# Patient Record
Sex: Male | Born: 1995 | Race: Black or African American | Hispanic: No | Marital: Single | Smoking: Current every day smoker
Health system: Southern US, Community
[De-identification: ages and names within clinical notes are randomized; demographics above are authoritative.]

## PROBLEM LIST (undated history)

## (undated) DIAGNOSIS — J302 Other seasonal allergic rhinitis: Secondary | ICD-10-CM

## (undated) HISTORY — DX: Other seasonal allergic rhinitis: J30.2

## (undated) HISTORY — PX: ADENOIDECTOMY W/ MYRINGOTOMY: SHX1128

## (undated) HISTORY — PX: TYMPANOSTOMY TUBE PLACEMENT: SHX32

---
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
## 2016-01-06 DIAGNOSIS — Z113 Encounter for screening for infections with a predominantly sexual mode of transmission: Secondary | ICD-10-CM | POA: Diagnosis not present

## 2016-05-01 ENCOUNTER — Ambulatory Visit (INDEPENDENT_AMBULATORY_CARE_PROVIDER_SITE_OTHER): Payer: 59 | Admitting: Internal Medicine

## 2016-05-01 VITALS — BP 112/84 | HR 60 | Temp 98.1°F | Resp 16 | Ht 68.75 in | Wt 154.0 lb

## 2016-05-01 DIAGNOSIS — J0100 Acute maxillary sinusitis, unspecified: Secondary | ICD-10-CM

## 2016-05-01 MED ORDER — AMOXICILLIN 875 MG PO TABS: 875.0000 mg | ORAL_TABLET | Freq: Two times a day (BID) | ORAL | Status: DC

## 2016-05-01 MED ORDER — DICLOFENAC SODIUM 75 MG PO TBEC: 75.0000 mg | DELAYED_RELEASE_TABLET | Freq: Two times a day (BID) | ORAL | Status: DC

## 2016-05-01 NOTE — Progress Notes (Addendum)
Subjective:    Patient ID: Ethan LacrosseEric C Cardoza Jr., male    DOB: 04/29/1996, [age] y.o.   MRN: [id number]  HPI Chief Complaint  Patient presents with  . Headache    x1week worse today patient states he has also had nasal congestion and sore throat     HPI Comments: Ethan Lacrosseric C Teicher Jr. is a 20 y.o. male who presents to the [facility] complaining of headache that began 5 days ago. Pt reports initial symptoms sinus congestion and sore throat days ago. He also had a cough but has resolved. Headache started to right side but has moved to temple region and behind bilateral eyes. He has associated photophobia and post nasal drip. He's taken dayquil with minimal relief. He denies fever, chills and sweats. Pt denies known drug allergies.   Patient Active Problem List   Diagnosis Date Noted  . Attention deficit hyperactivity disorder 02/06/2015   Past Medical History  Diagnosis Date  . Seasonal allergies    Past Surgical History  Procedure Laterality Date  . Adenoidectomy w/ myringotomy    . Tympanostomy tube placement      as infant/toddler   No Known Allergies Prior to Admission medications   Medication Sig Start Date End Date Taking? Authorizing Provider  ibuprofen (ADVIL,MOTRIN) 200 MG tablet Take 200 mg by mouth every 6 (six) hours as needed.   Yes Historical Provider, MD   Social History   Social History  . Marital Status: Single    Spouse Name: N/A  . Number of Children: N/A  . Years of Education: N/A   Occupational History  . Not on file.   Social History Main Topics  . Smoking status: Never Smoker   . Smokeless tobacco: Never Used  . Alcohol Use: No  . Drug Use: Yes    Special: Marijuana  . Sexual Activity: Yes    Birth Control/ Protection: Condom   Other Topics Concern  . Not on file   Social History Narrative   Review of Systems  Constitutional: Negative for fever, chills and diaphoresis.  HENT: Positive for congestion, postnasal drip and  sore throat.   Eyes: Positive for photophobia.  Respiratory: Positive for cough. Negative for wheezing.   Neurological: Positive for headaches.   Objective:   Physical Exam  Constitutional: He is oriented to person, place, and time. He appears well-developed and well-nourished. No distress.  HENT:  Head: Normocephalic and atraumatic.  Right Ear: External ear normal.  Left Ear: External ear normal.  Mouth/Throat: Posterior oropharyngeal erythema present. No oropharyngeal exudate.  Purulent discharge bilaterally with swollen turbinates.   Eyes: Conjunctivae and EOM are normal. Pupils are equal, round, and reactive to light.  Neck: Neck supple.  Cardiovascular: Normal rate.   Pulmonary/Chest: Effort normal.  Musculoskeletal: Normal range of motion.  Lymphadenopathy:    He has no cervical adenopathy.  Neurological: He is alert and oriented to person, place, and time.  Skin: Skin is warm and dry.  Psychiatric: He has a normal mood and affect. His behavior is normal.  Nursing note and vitals reviewed.  Filed Vitals:   05/01/16 1121  BP: 112/84  Pulse: 60  Temp: 98.1 F (36.7 C)  TempSrc: Oral  Resp: 16  Height: 5' 8.75" (1.746 m)  Weight: 154 lb (69.854 kg)  SpO2: 99%    Assessment & Plan:  Acute maxillary sinusitis, recurrence not specified Headache secondary  Meds ordered this encounter  Medications  . amoxicillin (AMOXIL) 875 MG tablet  Sig: Take 1 tablet (875 mg total) by mouth 2 (two) times daily.    Dispense:  20 tablet    Refill:  0  . diclofenac (VOLTAREN) 75 MG EC tablet    Sig: Take 1 tablet (75 mg total) by mouth 2 (two) times daily. As needed for headache    Dispense:  15 tablet    Refill:  0   Sudafed 12h  I have completed the patient encounter in its entirety as documented by the scribe, with editing by me where necessary. Ethan Carter P. Merla Riches, M.D.

## 2016-05-01 NOTE — Patient Instructions (Addendum)
12 hour sudafed to use twice a day for 5 days

## 2016-07-17 ENCOUNTER — Encounter (HOSPITAL_COMMUNITY): Payer: Self-pay | Admitting: *Deleted

## 2016-07-17 ENCOUNTER — Ambulatory Visit (HOSPITAL_COMMUNITY)
Admission: EM | Admit: 2016-07-17 | Discharge: 2016-07-17 | Disposition: A | Discharge status: Home / Self Care | Payer: 59 | Attending: Family Medicine | Admitting: Family Medicine

## 2016-07-17 DIAGNOSIS — J020 Streptococcal pharyngitis: Secondary | ICD-10-CM

## 2016-07-17 DIAGNOSIS — J029 Acute pharyngitis, unspecified: Secondary | ICD-10-CM | POA: Diagnosis not present

## 2016-07-17 LAB — POCT RAPID STREP A: STREPTOCOCCUS, GROUP A SCREEN (DIRECT): NEGATIVE

## 2016-07-17 MED ORDER — AMOXICILLIN 500 MG PO CAPS: 500.0000 mg | ORAL_CAPSULE | Freq: Three times a day (TID) | ORAL | 0 refills | Status: DC

## 2016-07-17 NOTE — ED Provider Notes (Signed)
[facility]    CSN: [id number] Arrival date & time: 07/17/16  1654  First Provider Contact:  First MD Initiated Contact with Patient 07/17/16 1818        History   Chief Complaint Chief Complaint  Patient presents with  . Sore Throat    HPI Ethan Carter. is a 20 y.o. male.   The history is provided by the patient.  Sore Throat  This is a new problem. The current episode started more than 2 days ago. The problem has been gradually worsening. Pertinent negatives include no chest pain, no abdominal pain, no headaches and no shortness of breath. The symptoms are aggravated by swallowing.    Past Medical History:  Diagnosis Date  . Seasonal allergies     Patient Active Problem List   Diagnosis Date Noted  . Attention deficit hyperactivity disorder 02/06/2015    Past Surgical History:  Procedure Laterality Date  . ADENOIDECTOMY W/ MYRINGOTOMY    . TYMPANOSTOMY TUBE PLACEMENT     as infant/toddler       Home Medications    Prior to Admission medications   Medication Sig Start Date End Date Taking? Authorizing Provider  amoxicillin (AMOXIL) 875 MG tablet Take 1 tablet (875 mg total) by mouth 2 (two) times daily. 05/01/16   Tonye Pearsonobert P Doolittle, MD  diclofenac (VOLTAREN) 75 MG EC tablet Take 1 tablet (75 mg total) by mouth 2 (two) times daily. As needed for headache 05/01/16   Tonye Pearsonobert P Doolittle, MD  ibuprofen (ADVIL,MOTRIN) 200 MG tablet Take 200 mg by mouth every 6 (six) hours as needed.    Historical Provider, MD    Family History Family History  Problem Relation Age of Onset  . Drug abuse Father   . Suicidality Cousin   . Depression Maternal Aunt   . Schizophrenia Maternal Uncle   . Drug abuse Maternal Grandfather     Social History Social History  Substance Use Topics  . Smoking status: Never Smoker  . Smokeless tobacco: Never Used  . Alcohol use No     Allergies   Review of patient's allergies indicates no known  allergies.   Review of Systems Review of Systems  Constitutional: Negative for chills and fever.  HENT: Positive for sore throat and trouble swallowing. Negative for congestion, postnasal drip and rhinorrhea.   Respiratory: Negative for cough and shortness of breath.   Cardiovascular: Negative for chest pain.  Gastrointestinal: Negative for abdominal pain.  Neurological: Negative for headaches.     Physical Exam Triage Vital Signs ED Triage Vitals  Enc Vitals Group     BP 07/17/16 1712 100/57     Pulse Rate 07/17/16 1712 64     Resp 07/17/16 1712 14     Temp 07/17/16 1712 98.3 F (36.8 C)     Temp Source 07/17/16 1712 Oral     SpO2 07/17/16 1712 100 %     Weight --      Height --      Head Circumference --      Peak Flow --      Pain Score 07/17/16 1811 7     Pain Loc --      Pain Edu? --      Excl. in GC? --    No data found.   Updated Vital Signs BP 100/57 (BP Location: Left Arm)   Pulse 64   Temp 98.3 F (36.8 C) (Oral)   Resp 14   SpO2 100%  Visual Acuity Right Eye Distance:   Left Eye Distance:   Bilateral Distance:    Right Eye Near:   Left Eye Near:    Bilateral Near:     Physical Exam  Constitutional: He is oriented to person, place, and time. He appears well-developed and well-nourished. No distress.  HENT:  Right Ear: External ear normal.  Left Ear: External ear normal.  Nose: Nose normal.  Mouth/Throat: Oropharyngeal exudate present.  Eyes: Pupils are equal, round, and reactive to light.  Neck: Normal range of motion. Neck supple.  Cardiovascular: Normal rate and regular rhythm.   Pulmonary/Chest: Effort normal and breath sounds normal.  Lymphadenopathy:    He has cervical adenopathy.  Neurological: He is alert and oriented to person, place, and time.  Skin: Skin is warm and dry.  Nursing note and vitals reviewed.    UC Treatments / Results  Labs (all labs ordered are listed, but only abnormal results are displayed) Labs Reviewed  - No data to display  EKG  EKG Interpretation None       Radiology No results found.  Procedures Procedures (including critical care time)  Medications Ordered in UC Medications - No data to display   Initial Impression / Assessment and Plan / UC Course  I have reviewed the triage vital signs and the nursing notes.  Pertinent labs & imaging results that were available during my care of the patient were reviewed by me and considered in my medical decision making (see chart for details).  Clinical Course      Final Clinical Impressions(s) / UC Diagnoses   Final diagnoses:  None    New Prescriptions New Prescriptions   No medications on file     Linna Hoff, MD 07/17/16 [phone]

## 2016-07-17 NOTE — ED Triage Notes (Addendum)
Sorethroat  With   Pain   When  He   Swallows   -  He   Reports     The     Symptoms      For  About  4  Days

## 2016-07-19 LAB — CULTURE, GROUP A STREP (THRC)

## 2016-12-03 ENCOUNTER — Ambulatory Visit (INDEPENDENT_AMBULATORY_CARE_PROVIDER_SITE_OTHER): Payer: 59 | Admitting: Physician Assistant

## 2016-12-03 ENCOUNTER — Encounter: Payer: Self-pay | Admitting: Physician Assistant

## 2016-12-03 VITALS — BP 122/72 | HR 55 | Temp 98.0°F | Resp 17 | Ht 69.5 in | Wt 154.0 lb

## 2016-12-03 DIAGNOSIS — R4589 Other symptoms and signs involving emotional state: Secondary | ICD-10-CM | POA: Diagnosis not present

## 2016-12-03 DIAGNOSIS — K0889 Other specified disorders of teeth and supporting structures: Secondary | ICD-10-CM | POA: Diagnosis not present

## 2016-12-03 MED ORDER — NAPROXEN 500 MG PO TABS: 500.0000 mg | ORAL_TABLET | Freq: Two times a day (BID) | ORAL | 0 refills | Status: AC

## 2016-12-03 MED ORDER — AMOXICILLIN-POT CLAVULANATE 875-125 MG PO TABS: 1.0000 | ORAL_TABLET | Freq: Two times a day (BID) | ORAL | 0 refills | Status: AC

## 2016-12-03 MED ORDER — TRAMADOL-ACETAMINOPHEN 37.5-325 MG PO TABS: 1.0000 | ORAL_TABLET | Freq: Four times a day (QID) | ORAL | 0 refills | Status: DC | PRN

## 2016-12-03 NOTE — Progress Notes (Signed)
Ethan Carter.  MRN: [id number] DOB: Sep 20, 1996  Subjective:  Ethan Carter. is a 21 y.o. male seen in office today for a chief complaint of bottom tooth pain x 2 weeks. Has associated headache and loss of sleep. Pain is worsened at night, with cold weather, and eating food. Patient knows he has a cavity in the tooth he is having pain in but he needs a root canal but has not scheduled it. He has a dentist but was confused about the process so he did not schedule the root canal yet. Has tried advil with moderate relief. His dentist is Dr. Chilton Si and his endodontist is Dr. Elliot Dally.    Pt also wants to talk about depression. Notes that his sister passed away ten years ago and has never felt the same since. States he has never really talked to anyone about this because he just bottles things in. He feels down and has little interest in hanging out with others. Notes his relationship with his family members have been difficult ever since his sister passed. He moved out of his mother's house 2 years ago into his father's house. He struggles to have a good relationship with his mother and grandmother and this really upsets him. He denies any suicidal ideation or attempts. He is hesitant about seeing a therapist but does agree it would probably be good for him. He does not want to start medication at this time.   Review of Systems  Constitutional: Negative for chills, fatigue and fever.  HENT: Negative for trouble swallowing and voice change.   Respiratory: Positive for shortness of breath (when he is sleeping).     Patient Active Problem List   Diagnosis Date Noted  . Attention deficit hyperactivity disorder 02/06/2015    Current Outpatient Prescriptions on File Prior to Visit  Medication Sig Dispense Refill  . ibuprofen (ADVIL,MOTRIN) 200 MG tablet Take 200 mg by mouth every 6 (six) hours as needed.     No current facility-administered medications on file prior to visit.    No Known  Allergies   Objective:  BP 122/72 (BP Location: Right Arm, Patient Position: Sitting, Cuff Size: Normal)   Pulse (!) 55   Temp 98 F (36.7 C) (Oral)   Resp 17   Ht 5' 9.5" (1.765 m)   Wt 154 lb (69.9 kg)   SpO2 99%   BMI 22.42 kg/m   Physical Exam  Constitutional: He is oriented to person, place, and time and well-developed, well-nourished, and in no distress.  HENT:  Head: Normocephalic and atraumatic.  Right Ear: Tympanic membrane, external ear and ear canal normal.  Left Ear: Tympanic membrane, external ear and ear canal normal.  Nose: Nose normal. Right sinus exhibits no maxillary sinus tenderness and no frontal sinus tenderness. Left sinus exhibits no maxillary sinus tenderness and no frontal sinus tenderness.  Mouth/Throat: Uvula is midline, oropharynx is clear and moist and mucous membranes are normal. Dental caries present.    Eyes: Conjunctivae are normal.  Neck: Normal range of motion.  Pulmonary/Chest: Effort normal.  Lymphadenopathy:       Head (right side): No submental, no submandibular, no tonsillar, no preauricular, no posterior auricular and no occipital adenopathy present.       Head (left side): No submental, no submandibular, no tonsillar, no preauricular, no posterior auricular and no occipital adenopathy present.    He has no cervical adenopathy.       Right: No supraclavicular adenopathy present.  Left: No supraclavicular adenopathy present.  Neurological: He is alert and oriented to person, place, and time. Gait normal.  Skin: Skin is warm and dry.  Psychiatric: Affect normal.  Vitals reviewed.   Assessment and Plan :  1. Tooth pain -I do not palpate any dental abscess, but will cover for any underlying bacterial infection. We have also contacted Dr. Elliot DallyMickens and scheduled a root canal for 12/13/16 at 2pm. Pt given NSAIDs for pain and instructed to call our office if this is not providing enough relief.  - amoxicillin-clavulanate (AUGMENTIN)  875-125 MG tablet; Take 1 tablet by mouth 2 (two) times daily.  Dispense: 14 tablet; Refill: 0 - naproxen (NAPROSYN) 500 MG tablet; Take 1 tablet (500 mg total) by mouth 2 (two) times daily with a meal.  Dispense: 30 tablet; Refill: 0  2. Sad mood -Spent a great deal of time discussing pt's mood. He has agreed to contact local therapists and also agreed to have a talk with his mother about their relationship. He is to follow up with me on 12/22/16 and we will discuss how his therapy sessions are going. May consider initiating medication at this visit if feel it is necessary.   A total of 25 minutes was spent in the room with the patient, greater than 50% of which was in discussion of treatment options for sad mood.   Benjiman CoreBrittany Wiseman PA-C  [facility] 12/03/2016 10:08 AM

## 2016-12-03 NOTE — Patient Instructions (Addendum)
Take antibiotic as prescribed.  Make sure go to the dentist on the 29th to have the root canal.   For pain, I want you take naproxen at 10am and then again at 10pm. If you develop any fever, chills, or worsening pain, return to our clinic.   For local therapists, info is below.  I would like you to come back and see me on 12/22/16 and let's see how you are doing.  [facility] [address] [place], [place] [address]  Shanon RosserBarbara Farran [phone]  Maris BergerKathy Kirstner [phone]  Marco CollieSusan Kroll-Smith [phone]   IF you received an x-ray today, you will receive an invoice from [facility]. Please contact [facility] at [phone] with questions or concerns regarding your invoice.   IF you received labwork today, you will receive an invoice from [facility]. Please contact [facility] at [phone] with questions or concerns regarding your invoice.   Our billing staff will not be able to assist you with questions regarding bills from these companies.  You will be contacted with the lab results as soon as they are available. The fastest way to get your results is to activate your My Chart account. Instructions are located on the last page of this paperwork. If you have not heard from us regarding the results in 2 weeks, please contact this office.

## 2016-12-22 ENCOUNTER — Ambulatory Visit: Payer: 59 | Admitting: Physician Assistant

## 2017-07-04 DIAGNOSIS — H5213 Myopia, bilateral: Secondary | ICD-10-CM | POA: Diagnosis not present

## 2018-02-28 ENCOUNTER — Emergency Department (HOSPITAL_COMMUNITY)
Admission: EM | Admit: 2018-02-28 | Discharge: 2018-02-28 | Disposition: A | Discharge status: Home / Self Care | Payer: 59 | Attending: Emergency Medicine | Admitting: Emergency Medicine

## 2018-02-28 ENCOUNTER — Encounter (HOSPITAL_COMMUNITY): Payer: Self-pay

## 2018-02-28 ENCOUNTER — Other Ambulatory Visit: Payer: Self-pay

## 2018-02-28 DIAGNOSIS — Y999 Unspecified external cause status: Secondary | ICD-10-CM | POA: Diagnosis not present

## 2018-02-28 DIAGNOSIS — Y929 Unspecified place or not applicable: Secondary | ICD-10-CM | POA: Insufficient documentation

## 2018-02-28 DIAGNOSIS — S0501XA Injury of conjunctiva and corneal abrasion without foreign body, right eye, initial encounter: Secondary | ICD-10-CM

## 2018-02-28 DIAGNOSIS — S0990XA Unspecified injury of head, initial encounter: Secondary | ICD-10-CM | POA: Diagnosis not present

## 2018-02-28 DIAGNOSIS — Y939 Activity, unspecified: Secondary | ICD-10-CM | POA: Diagnosis not present

## 2018-02-28 DIAGNOSIS — H5711 Ocular pain, right eye: Secondary | ICD-10-CM | POA: Diagnosis not present

## 2018-02-28 MED ORDER — IBUPROFEN 600 MG PO TABS: 600.0000 mg | ORAL_TABLET | Freq: Four times a day (QID) | ORAL | 0 refills | Status: DC | PRN

## 2018-02-28 MED ORDER — TETRACAINE HCL 0.5 % OP SOLN
2.0000 [drp] | Freq: Once | OPHTHALMIC | Status: AC
  Administered 2018-02-28: 2 [drp] via OPHTHALMIC
  Filled 2018-02-28: qty 4

## 2018-02-28 MED ORDER — TOBRAMYCIN 0.3 % OP SOLN: 2.0000 [drp] | OPHTHALMIC | 0 refills | Status: DC

## 2018-02-28 MED ORDER — FLUORESCEIN SODIUM 1 MG OP STRP
ORAL_STRIP | OPHTHALMIC | Status: AC
  Filled 2018-02-28: qty 1
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
## 2018-02-28 NOTE — Discharge Instructions (Signed)
See the Opt omologist if pain persist past 48 hours
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
## 2018-03-02 DIAGNOSIS — S0501XA Injury of conjunctiva and corneal abrasion without foreign body, right eye, initial encounter: Secondary | ICD-10-CM | POA: Diagnosis not present

## 2018-03-16 DIAGNOSIS — S0501XD Injury of conjunctiva and corneal abrasion without foreign body, right eye, subsequent encounter: Secondary | ICD-10-CM | POA: Diagnosis not present

## 2018-09-20 ENCOUNTER — Ambulatory Visit (HOSPITAL_COMMUNITY)
Admission: EM | Admit: 2018-09-20 | Discharge: 2018-09-20 | Disposition: A | Discharge status: Home / Self Care | Payer: 59 | Attending: Family Medicine | Admitting: Family Medicine

## 2018-09-20 ENCOUNTER — Encounter (HOSPITAL_COMMUNITY): Payer: Self-pay

## 2018-09-20 ENCOUNTER — Ambulatory Visit (INDEPENDENT_AMBULATORY_CARE_PROVIDER_SITE_OTHER): Payer: 59

## 2018-09-20 DIAGNOSIS — R079 Chest pain, unspecified: Secondary | ICD-10-CM | POA: Diagnosis not present

## 2018-09-20 DIAGNOSIS — R05 Cough: Secondary | ICD-10-CM

## 2018-09-20 DIAGNOSIS — R059 Cough, unspecified: Secondary | ICD-10-CM

## 2018-09-20 DIAGNOSIS — R0789 Other chest pain: Secondary | ICD-10-CM | POA: Diagnosis not present

## 2018-09-20 MED ORDER — FLUTICASONE PROPIONATE 50 MCG/ACT NA SUSP: 1.0000 | Freq: Every day | NASAL | 2 refills | Status: DC

## 2018-09-20 MED ORDER — CETIRIZINE HCL 10 MG PO TABS: 10.0000 mg | ORAL_TABLET | Freq: Every day | ORAL | 0 refills | Status: DC

## 2018-09-20 MED ORDER — ALBUTEROL SULFATE HFA 108 (90 BASE) MCG/ACT IN AERS: 1.0000 | INHALATION_SPRAY | Freq: Four times a day (QID) | RESPIRATORY_TRACT | 0 refills | Status: DC | PRN

## 2018-09-20 NOTE — ED Notes (Signed)
No answer

## 2018-09-20 NOTE — ED Triage Notes (Signed)
Pt presents with coughing up unknown mucus; pt believes he has black mold in his home that hes breathing in.

## 2018-09-20 NOTE — ED Provider Notes (Signed)
[facility]    CSN: [id number] Arrival date & time: 09/20/18  1412     History   Chief Complaint Chief Complaint  Patient presents with  . Coughing up  unknown mucus    HPI Ethan Carter. is a 22 y.o. male.   Pt is a 22 year old male that presents with cough, congestion for a few weeks and worsening. He has been coughing up black mucous. He has been having some chest pain and SOB at times. He reports severe amount of mold in his home that he has been breathing in for some time. He does not smoke. No fever, chills, body aches. He reports that when goes outside that his symptoms improve. He does have hx of allergies.   ROS per HPI      Past Medical History:  Diagnosis Date  . Seasonal allergies     Patient Active Problem List   Diagnosis Date Noted  . Attention deficit hyperactivity disorder 02/06/2015    Past Surgical History:  Procedure Laterality Date  . ADENOIDECTOMY W/ MYRINGOTOMY    . TYMPANOSTOMY TUBE PLACEMENT     as infant/toddler       Home Medications    Prior to Admission medications   Medication Sig Start Date End Date Taking? Authorizing Provider  albuterol (PROVENTIL HFA;VENTOLIN HFA) 108 (90 Base) MCG/ACT inhaler Inhale 1-2 puffs into the lungs every 6 (six) hours as needed for wheezing or shortness of breath. 09/20/18   Dahlia Byes A, NP  cetirizine (ZYRTEC) 10 MG tablet Take 1 tablet (10 mg total) by mouth daily. 09/20/18   Aviyanna Colbaugh, Gloris Manchester A, NP  fluticasone (FLONASE) 50 MCG/ACT nasal spray Place 1 spray into both nostrils daily. 09/20/18   Dahlia Byes A, NP  ibuprofen (ADVIL,MOTRIN) 600 MG tablet Take 1 tablet (600 mg total) by mouth every 6 (six) hours as needed. 02/28/18   Elson Areas, PA-C  naproxen (NAPROSYN) 500 MG tablet Take 1 tablet (500 mg total) by mouth 2 (two) times daily with a meal. 12/03/16   Barnett Abu, [place] D, PA-C  tobramycin (TOBREX) 0.3 % ophthalmic solution Place 2 drops into the right eye every 4 (four) hours.  02/28/18   Elson Areas, PA-C    Family History Family History  Problem Relation Age of Onset  . Drug abuse Father   . Suicidality Cousin   . Depression Maternal Aunt   . Schizophrenia Maternal Uncle   . Drug abuse Maternal Grandfather     Social History Social History   Tobacco Use  . Smoking status: Current Every Day Smoker    Packs/day: 0.30    Years: 3.00    Pack years: 0.90  . Smokeless tobacco: Never Used  Substance Use Topics  . Alcohol use: No  . Drug use: Yes    Types: Marijuana     Allergies   Lactose intolerance (gi)   Review of Systems Review of Systems   Physical Exam Triage Vital Signs ED Triage Vitals  Enc Vitals Group     BP 09/20/18 1551 132/71     Pulse Rate 09/20/18 1551 72     Resp 09/20/18 1551 20     Temp 09/20/18 1551 98.9 F (37.2 C)     Temp Source 09/20/18 1551 Oral     SpO2 09/20/18 1551 100 %     Weight --      Height --      Head Circumference --      Peak  Flow --      Pain Score 09/20/18 1553 6     Pain Loc --      Pain Edu? --      Excl. in GC? --    No data found.  Updated Vital Signs BP 132/71 (BP Location: Left Arm)   Pulse 72   Temp 98.9 F (37.2 C) (Oral)   Resp 20   SpO2 100%   Visual Acuity Right Eye Distance:   Left Eye Distance:   Bilateral Distance:    Right Eye Near:   Left Eye Near:    Bilateral Near:     Physical Exam  Constitutional: He appears well-developed and well-nourished.  Very pleasant. Non toxic or ill appearing.   HENT:  Head: Normocephalic and atraumatic.  Eyes: Conjunctivae are normal.  Neck: Normal range of motion.  Cardiovascular: Normal rate, regular rhythm and normal heart sounds.  Pulmonary/Chest: Effort normal and breath sounds normal.  Lungs clear in all fields. No dyspnea or distress. No retractions or nasal flaring.   Musculoskeletal: Normal range of motion.  Neurological: He is alert.  Skin: Skin is warm and dry.  Psychiatric: He has a normal mood and affect.    Nursing note and vitals reviewed.    UC Treatments / Results  Labs (all labs ordered are listed, but only abnormal results are displayed) Labs Reviewed - No data to display  EKG None  Radiology Dg Chest 2 View  Result Date: 09/20/2018 CLINICAL DATA:  Chest pain and cough EXAM: CHEST - 2 VIEW COMPARISON:  12/11/2007 FINDINGS: The heart size and mediastinal contours are within normal limits. Both lungs are clear. The visualized skeletal structures are unremarkable. IMPRESSION: No active cardiopulmonary disease. Electronically Signed   By: Jasmine Pang M.D.   On: 09/20/2018 17:11    Procedures Procedures (including critical care time)  Medications Ordered in UC Medications - No data to display  Initial Impression / Assessment and Plan / UC Course  I have reviewed the triage vital signs and the nursing notes.  Pertinent labs & imaging results that were available during my care of the patient were reviewed by me and considered in my medical decision making (see chart for details).     Patient x-ray negative for any abnormalities Prescribed medications to include Zyrtec, Flonase and albuterol inhaler for patient's symptoms Pt plans on moving from the house to get away from the mold exposure.  Follow up as needed for continued or worsening symptoms   Final Clinical Impressions(s) / UC Diagnoses   Final diagnoses:  Cough     Discharge Instructions     Your x-ray was negative for any abnormalities We can treat your symptoms with Zyrtec daily, Flonase nasal spray daily and albuterol inhaler as needed for cough, wheezing, shortness of breath. The only way her symptoms are going to improve is a few fine and had a place to live that is mold free.     ED Prescriptions    Medication Sig Dispense Auth. Provider   albuterol (PROVENTIL HFA;VENTOLIN HFA) 108 (90 Base) MCG/ACT inhaler Inhale 1-2 puffs into the lungs every 6 (six) hours as needed for wheezing or shortness of  breath. 1 Inhaler Deseree Zemaitis A, NP   fluticasone (FLONASE) 50 MCG/ACT nasal spray Place 1 spray into both nostrils daily. 16 g Emrie Gayle A, NP   cetirizine (ZYRTEC) 10 MG tablet Take 1 tablet (10 mg total) by mouth daily. 30 tablet Janace Aris, NP  Controlled Substance Prescriptions [place] Controlled Substance Registry consulted? Not Applicable   Janace Aris, NP 09/20/18 1740

## 2018-09-20 NOTE — Discharge Instructions (Signed)
Your x-ray was negative for any abnormalities We can treat your symptoms with Zyrtec daily, Flonase nasal spray daily and albuterol inhaler as needed for cough, wheezing, shortness of breath. The only way her symptoms are going to improve is a few fine and had a place to live that is mold free.

## 2018-10-05 DIAGNOSIS — H5213 Myopia, bilateral: Secondary | ICD-10-CM | POA: Diagnosis not present

## 2018-12-08 DIAGNOSIS — Z113 Encounter for screening for infections with a predominantly sexual mode of transmission: Secondary | ICD-10-CM | POA: Diagnosis not present

## 2018-12-08 DIAGNOSIS — N341 Nonspecific urethritis: Secondary | ICD-10-CM | POA: Diagnosis not present

## 2020-05-08 ENCOUNTER — Ambulatory Visit (INDEPENDENT_AMBULATORY_CARE_PROVIDER_SITE_OTHER): Payer: BC Managed Care – PPO

## 2020-05-08 ENCOUNTER — Encounter (HOSPITAL_COMMUNITY): Payer: Self-pay

## 2020-05-08 ENCOUNTER — Other Ambulatory Visit: Payer: Self-pay

## 2020-05-08 ENCOUNTER — Ambulatory Visit (HOSPITAL_COMMUNITY)
Admission: EM | Admit: 2020-05-08 | Discharge: 2020-05-08 | Disposition: A | Discharge status: Home / Self Care | Payer: BC Managed Care – PPO | Attending: Family Medicine | Admitting: Family Medicine

## 2020-05-08 DIAGNOSIS — S93402A Sprain of unspecified ligament of left ankle, initial encounter: Secondary | ICD-10-CM | POA: Diagnosis not present

## 2020-05-08 DIAGNOSIS — S99912A Unspecified injury of left ankle, initial encounter: Secondary | ICD-10-CM | POA: Diagnosis not present

## 2020-05-08 MED ORDER — IBUPROFEN 600 MG PO TABS: 600.0000 mg | ORAL_TABLET | Freq: Four times a day (QID) | ORAL | 0 refills | Status: AC | PRN

## 2020-05-08 NOTE — Discharge Instructions (Addendum)
Wear the brace and use crutches and until follow up evaluation. No weight bearing  Take ibuprofen every 6 hours for the next few days, then as needed  Ice and elevate tonight  Call the [facility] for follow up

## 2020-05-08 NOTE — ED Notes (Signed)
Spoke to [employer], pa about patient splint.  Langston Masker, pa reports ASO is appropriate for patient.  Order placed

## 2020-05-08 NOTE — ED Triage Notes (Signed)
Patient reports he was playing basketball when he injured his left ankle.

## 2020-05-08 NOTE — ED Provider Notes (Signed)
[facility]    CSN: [id number] Arrival date & time: 05/08/20  1343      History   Chief Complaint Chief Complaint  Patient presents with  . Ankle Injury    HPI Ethan Carter. is a 24 y.o. male.   Patient reports for evaluation of left ankle injury.  He reports he was playing basketball earlier today when he came down on his left ankle.  He rolled it.  Reports having pain over the outside of the ankle and some swelling.  He has not been able to walk due to pain.     Past Medical History:  Diagnosis Date  . Seasonal allergies     Patient Active Problem List   Diagnosis Date Noted  . Attention deficit hyperactivity disorder 02/06/2015    Past Surgical History:  Procedure Laterality Date  . ADENOIDECTOMY W/ MYRINGOTOMY    . TYMPANOSTOMY TUBE PLACEMENT     as infant/toddler       Home Medications    Prior to Admission medications   Medication Sig Start Date End Date Taking? Authorizing Provider  albuterol (PROVENTIL HFA;VENTOLIN HFA) 108 (90 Base) MCG/ACT inhaler Inhale 1-2 puffs into the lungs every 6 (six) hours as needed for wheezing or shortness of breath. 09/20/18   Dahlia Byes A, NP  cetirizine (ZYRTEC) 10 MG tablet Take 1 tablet (10 mg total) by mouth daily. 09/20/18   Bast, Gloris Manchester A, NP  fluticasone (FLONASE) 50 MCG/ACT nasal spray Place 1 spray into both nostrils daily. 09/20/18   Dahlia Byes A, NP  ibuprofen (ADVIL) 600 MG tablet Take 1 tablet (600 mg total) by mouth every 6 (six) hours as needed. 05/08/20   Saxton Chain, Veryl Speak, PA-C  naproxen (NAPROSYN) 500 MG tablet Take 1 tablet (500 mg total) by mouth 2 (two) times daily with a meal. 12/03/16   Barnett Abu, [place] D, PA-C  tobramycin (TOBREX) 0.3 % ophthalmic solution Place 2 drops into the right eye every 4 (four) hours. 02/28/18   Elson Areas, PA-C    Family History Family History  Problem Relation Age of Onset  . Drug abuse Father   . Suicidality Cousin   . Depression Maternal Aunt   .  Schizophrenia Maternal Uncle   . Drug abuse Maternal Grandfather     Social History Social History   Tobacco Use  . Smoking status: Current Every Day Smoker    Packs/day: 0.30    Years: 3.00    Pack years: 0.90  . Smokeless tobacco: Never Used  Substance Use Topics  . Alcohol use: No  . Drug use: Yes    Types: Marijuana     Allergies   Lactose intolerance (gi)   Review of Systems Review of Systems   Physical Exam Triage Vital Signs ED Triage Vitals  Enc Vitals Group     BP 05/08/20 1500 131/69     Pulse Rate 05/08/20 1500 (!) 55     Resp 05/08/20 1500 12     Temp 05/08/20 1500 98 F (36.7 C)     Temp src --      SpO2 05/08/20 1500 100 %     Weight --      Height --      Head Circumference --      Peak Flow --      Pain Score 05/08/20 1459 9     Pain Loc --      Pain Edu? --      Excl. in  GC? --    No data found.  Updated Vital Signs BP 131/69   Pulse (!) 55   Temp 98 F (36.7 C)   Resp 12   SpO2 100%   Visual Acuity Right Eye Distance:   Left Eye Distance:   Bilateral Distance:    Right Eye Near:   Left Eye Near:    Bilateral Near:     Physical Exam Vitals and nursing note reviewed.  Constitutional:      Appearance: Normal appearance.  Musculoskeletal:     Comments: Left ankle with swelling over the lateral malleolus.  There is tenderness over the lateral malleolus.  Tenderness over the distribution of all of the lateral ligaments or of the ankle.  No significant anterior movement.  Squeeze test negative.  Talar tilt deferred.  Good pulses.  Cap refill less than 2 seconds.  Patient able to move the ankle however patient not able to ambulate well.  No proximal fibular tenderness.  No knee tenderness.  Neurological:     Mental Status: He is alert.      UC Treatments / Results  Labs (all labs ordered are listed, but only abnormal results are displayed) Labs Reviewed - No data to display  EKG   Radiology DG Ankle Complete  Left  Result Date: 05/08/2020 CLINICAL DATA:  24 year old male with trauma to the left ankle. EXAM: LEFT ANKLE COMPLETE - 3+ VIEW COMPARISON:  Right ankle radiograph dated 03/24/2011. FINDINGS: There is no acute fracture or dislocation. The bones are well mineralized. No arthritic changes. The ankle mortise is intact. There is soft tissue swelling over the lateral malleolus. No radiopaque foreign object or soft tissue gas. IMPRESSION: No acute fracture or dislocation. Electronically Signed   By: Anner Crete M.D.   On: 05/08/2020 15:28    Procedures Procedures (including critical care time)  Medications Ordered in UC Medications - No data to display  Initial Impression / Assessment and Plan / UC Course  I have reviewed the triage vital signs and the nursing notes.  Pertinent labs & imaging results that were available during my care of the patient were reviewed by me and considered in my medical decision making (see chart for details).     #Left ankle sprain Patient is a 24 year old presenting with left ankle sprain.  X-ray negative for fracture today.   Likely grade 2 sprain.  Ankle feels grossly stable.  Will place in ASO lace up brace and keep him nonweightbearing until follow-up with sports medicine for reevaluation next week.  Ibuprofen for pain and inflammation.  Instructed to ice and elevate tonight.  Patient verbalized understanding plan Final Clinical Impressions(s) / UC Diagnoses   Final diagnoses:  Sprain of left ankle, unspecified ligament, initial encounter     Discharge Instructions     Wear the brace and use crutches and until follow up evaluation. No weight bearing  Take ibuprofen every 6 hours for the next few days, then as needed  Ice and elevate tonight  Call the sports medecine group for follow up        ED Prescriptions    Medication Sig Dispense Auth. Provider   ibuprofen (ADVIL) 600 MG tablet Take 1 tablet (600 mg total) by mouth every 6 (six)  hours as needed. 30 tablet Issac Moure, Marguerita Beards, PA-C     PDMP not reviewed this encounter.   Purnell Shoemaker, PA-C 05/08/20 2334

## 2020-05-23 ENCOUNTER — Ambulatory Visit (INDEPENDENT_AMBULATORY_CARE_PROVIDER_SITE_OTHER): Payer: BC Managed Care – PPO | Admitting: Family Medicine

## 2020-05-23 ENCOUNTER — Ambulatory Visit
Admission: RE | Admit: 2020-05-23 | Discharge: 2020-05-23 | Disposition: A | Discharge status: Home / Self Care | Payer: BC Managed Care – PPO | Source: Ambulatory Visit | Attending: Family Medicine | Admitting: Family Medicine

## 2020-05-23 ENCOUNTER — Other Ambulatory Visit: Payer: Self-pay

## 2020-05-23 VITALS — BP 122/78 | Ht 68.5 in | Wt 145.0 lb

## 2020-05-23 DIAGNOSIS — M25572 Pain in left ankle and joints of left foot: Secondary | ICD-10-CM | POA: Diagnosis not present

## 2020-05-23 NOTE — Progress Notes (Signed)
  Ethan Carter. - 23 y.o. male MRN [id number]  Date of birth: 05-27-1996    SUBJECTIVE:      Chief Complaint:/ HPI:  This playing basketball on 624 when he came down from a jump shot and landed oddly on his left foot.  He felt like he rolled his ankle.  He was seen in the emergency department where they did ankle x-rays.  They gave him crutches but he tried not to use them because he does not like them.  He had significant swelling early on but that is gotten some better.  He is walking and tennis shoes now but still having pain.  Swelling has not totally resolved.    OBJECTIVE: BP 122/78   Ht 5' 8.5" (1.74 m)   Wt 145 lb (65.8 kg)   BMI 21.73 kg/m   Physical Exam:  Vital signs are reviewed. Left ankle swollen particularly on the lateral portion.  He is tender to palpation over the lateral epicondyle and he is also extremely tender point palpation over the proximal fifth metatarsal.  He has intact sensation.  Normal capillary refill. Ankle x-rays are reviewed.  They do not include more than 1 view of the fifth metatarsal.  ASSESSMENT & PLAN:  See problem based charting & AVS for pt instructions. No problem-specific Assessment & Plan notes found for this encounter.

## 2020-05-23 NOTE — Assessment & Plan Note (Addendum)
I would like to repeat ankle series and also get a foot series.  I am concerned about the fifth metatarsal point tenderness.  Placed him in an ASO.  .  Follow-up 2 to 3 weeks.  Discussed with him and mom.  He is not currently working so he does not need a note for work.  Activity as tolerated but no jumping or running.  Home exercise program given in handout form and exercises explained.

## 2020-05-23 NOTE — Patient Instructions (Addendum)
Thank you for coming in to see [place] today! Please see below to review our plan for today's visit:  1. Ice for 15 minutes 1-3 times daily. Elevate and rest the foot/ankle. 2. Go get your xrays. 3. Do ankle and foot exercises twice daily. Continue ASO brace to ankle.  4. Follow up 2-3 weeks or sooner as needed.  Please call the clinic at [phone] if your symptoms worsen or you have any concerns. It was our pleasure to serve you!    Dr. Denny Levy Dr. Peggyann Shoals [facility]

## 2020-06-02 ENCOUNTER — Encounter: Payer: Self-pay | Admitting: Family Medicine

## 2020-06-02 NOTE — Progress Notes (Signed)
Letter sent  No fracture  Has follow up appt

## 2020-06-06 ENCOUNTER — Ambulatory Visit: Payer: BC Managed Care – PPO | Admitting: Family Medicine

## 2020-09-24 ENCOUNTER — Ambulatory Visit (INDEPENDENT_AMBULATORY_CARE_PROVIDER_SITE_OTHER): Payer: BC Managed Care – PPO | Admitting: Physician Assistant

## 2020-09-24 ENCOUNTER — Other Ambulatory Visit: Payer: Self-pay

## 2020-09-24 ENCOUNTER — Other Ambulatory Visit (HOSPITAL_COMMUNITY)
Admission: RE | Admit: 2020-09-24 | Discharge: 2020-09-24 | Disposition: A | Discharge status: Home / Self Care | Payer: BC Managed Care – PPO | Source: Ambulatory Visit | Attending: Physician Assistant | Admitting: Physician Assistant

## 2020-09-24 ENCOUNTER — Encounter: Payer: Self-pay | Admitting: Physician Assistant

## 2020-09-24 VITALS — BP 115/62 | HR 76 | Temp 97.6°F | Resp 18 | Ht 68.0 in | Wt 149.0 lb

## 2020-09-24 DIAGNOSIS — Z7689 Persons encountering health services in other specified circumstances: Secondary | ICD-10-CM

## 2020-09-24 DIAGNOSIS — R39198 Other difficulties with micturition: Secondary | ICD-10-CM

## 2020-09-24 DIAGNOSIS — K5900 Constipation, unspecified: Secondary | ICD-10-CM

## 2020-09-24 DIAGNOSIS — R6889 Other general symptoms and signs: Secondary | ICD-10-CM | POA: Diagnosis not present

## 2020-09-24 DIAGNOSIS — B356 Tinea cruris: Secondary | ICD-10-CM

## 2020-09-24 LAB — POCT URINALYSIS DIP (CLINITEK)
Bilirubin, UA: NEGATIVE
Blood, UA: NEGATIVE
Glucose, UA: NEGATIVE mg/dL
Ketones, POC UA: NEGATIVE mg/dL
Leukocytes, UA: NEGATIVE
Nitrite, UA: NEGATIVE
POC PROTEIN,UA: NEGATIVE
Spec Grav, UA: 1.02 (ref 1.010–1.025)
Urobilinogen, UA: 0.2 E.U./dL
pH, UA: 8 (ref 5.0–8.0)

## 2020-09-24 MED ORDER — CETIRIZINE HCL 10 MG PO TABS: 10.0000 mg | ORAL_TABLET | Freq: Every day | ORAL | 11 refills | Status: AC

## 2020-09-24 MED ORDER — NYSTATIN 100000 UNIT/GM EX OINT: 1.0000 "application " | TOPICAL_OINTMENT | Freq: Two times a day (BID) | CUTANEOUS | 0 refills | Status: AC

## 2020-09-24 NOTE — Progress Notes (Signed)
Patient presents for recheck of STI concerns from previous treatment. Patient complains of dry area on his penis which itches and peels. Patient complains of not having a full stream when urinating and feeling pressure at the end. Patient complains of "erections being weaker"  Patient reports partner is being treated for BV and yeast currently.

## 2020-09-24 NOTE — Patient Instructions (Signed)
For your constipation - make sure you are drinking 80 ounces of water and eating lots of fiber  You can use Prep H over the counter to help resolve the current bleeding   For your dry scaly skin, use the nystatin cream until it is resolved and then use it for one more week  For your itchy eye, please use Zyrtec daily  We will call you with your lab results  Roney Jaffeari S. Mayers, PA-C Physician Assistant [facility] [email or web address]   Constipation, Adult Constipation is when a person has fewer bowel movements in a week than normal, has difficulty having a bowel movement, or has stools that are dry, hard, or larger than normal. Constipation may be caused by an underlying condition. It may become worse with age if a person takes certain medicines and does not take in enough fluids. Follow these instructions at home: Eating and drinking   Eat foods that have a lot of fiber, such as fresh fruits and vegetables, whole grains, and beans.  Limit foods that are high in fat, low in fiber, or overly processed, such as french fries, hamburgers, cookies, candies, and soda.  Drink enough fluid to keep your urine clear or pale yellow. General instructions  Exercise regularly or as told by your health care provider.  Go to the restroom when you have the urge to go. Do not hold it in.  Take over-the-counter and prescription medicines only as told by your health care provider. These include any fiber supplements.  Practice pelvic floor retraining exercises, such as deep breathing while relaxing the lower abdomen and pelvic floor relaxation during bowel movements.  Watch your condition for any changes.  Keep all follow-up visits as told by your health care provider. This is important. Contact a health care provider if:  You have pain that gets worse.  You have a fever.  You do not have a bowel movement after 4 days.  You vomit.  You  are not hungry.  You lose weight.  You are bleeding from the anus.  You have thin, pencil-like stools. Get help right away if:  You have a fever and your symptoms suddenly get worse.  You leak stool or have blood in your stool.  Your abdomen is bloated.  You have severe pain in your abdomen.  You feel dizzy or you faint. This information is not intended to replace advice given to you by your health care provider. Make sure you discuss any questions you have with your health care provider. Document Revised: 10/14/2017 Document Reviewed: 04/21/2016 Elsevier Patient Education  2020 [employer]   Hemorrhoids Hemorrhoids are swollen veins in and around the rectum or anus. There are two types of hemorrhoids:  Internal hemorrhoids. These occur in the veins that are just inside the rectum. They may poke through to the outside and become irritated and painful.  External hemorrhoids. These occur in the veins that are outside the anus and can be felt as a painful swelling or hard lump near the anus. Most hemorrhoids do not cause serious problems, and they can be managed with home treatments such as diet and lifestyle changes. If home treatments do not help the symptoms, procedures can be done to shrink or remove the hemorrhoids. What are the causes? This condition is caused by increased pressure in the anal area. This pressure may result from various things, including:  Constipation.  Straining to have a bowel movement.  Diarrhea.  Pregnancy.  Obesity.  Sitting for  long periods of time.  Heavy lifting or other activity that causes you to strain.  Anal sex.  Riding a bike for a long period of time. What are the signs or symptoms? Symptoms of this condition include:  Pain.  Anal itching or irritation.  Rectal bleeding.  Leakage of stool (feces).  Anal swelling.  One or more lumps around the anus. How is this diagnosed? This condition can often be diagnosed  through a visual exam. Other exams or tests may also be done, such as:  An exam that involves feeling the rectal area with a gloved hand (digital rectal exam).  An exam of the anal canal that is done using a small tube (anoscope).  A blood test, if you have lost a significant amount of blood.  A test to look inside the colon using a flexible tube with a camera on the end (sigmoidoscopy or colonoscopy). How is this treated? This condition can usually be treated at home. However, various procedures may be done if dietary changes, lifestyle changes, and other home treatments do not help your symptoms. These procedures can help make the hemorrhoids smaller or remove them completely. Some of these procedures involve surgery, and others do not. Common procedures include:  Rubber band ligation. Rubber bands are placed at the base of the hemorrhoids to cut off their blood supply.  Sclerotherapy. Medicine is injected into the hemorrhoids to shrink them.  Infrared coagulation. A type of light energy is used to get rid of the hemorrhoids.  Hemorrhoidectomy surgery. The hemorrhoids are surgically removed, and the veins that supply them are tied off.  Stapled hemorrhoidopexy surgery. The surgeon staples the base of the hemorrhoid to the rectal wall. Follow these instructions at home: Eating and drinking   Eat foods that have a lot of fiber in them, such as whole grains, beans, nuts, fruits, and vegetables.  Ask your health care provider about taking products that have added fiber (fiber supplements).  Reduce the amount of fat in your diet. You can do this by eating low-fat dairy products, eating less red meat, and avoiding processed foods.  Drink enough fluid to keep your urine pale yellow. Managing pain and swelling   Take warm sitz baths for 20 minutes, 3-4 times a day to ease pain and discomfort. You may do this in a bathtub or using a portable sitz bath that fits over the toilet.  If  directed, apply ice to the affected area. Using ice packs between sitz baths may be helpful. ? Put ice in a plastic bag. ? Place a towel between your skin and the bag. ? Leave the ice on for 20 minutes, 2-3 times a day. General instructions  Take over-the-counter and prescription medicines only as told by your health care provider.  Use medicated creams or suppositories as told.  Get regular exercise. Ask your health care provider how much and what kind of exercise is best for you. In general, you should do moderate exercise for at least 30 minutes on most days of the week (150 minutes each week). This can include activities such as walking, biking, or yoga.  Go to the bathroom when you have the urge to have a bowel movement. Do not wait.  Avoid straining to have bowel movements.  Keep the anal area dry and clean. Use wet toilet paper or moist towelettes after a bowel movement.  Do not sit on the toilet for long periods of time. This increases blood pooling and pain.  Keep  all follow-up visits as told by your health care provider. This is important. Contact a health care provider if you have:  Increasing pain and swelling that are not controlled by treatment or medicine.  Difficulty having a bowel movement, or you are unable to have a bowel movement.  Pain or inflammation outside the area of the hemorrhoids. Get help right away if you have:  Uncontrolled bleeding from your rectum. Summary  Hemorrhoids are swollen veins in and around the rectum or anus.  Most hemorrhoids can be managed with home treatments such as diet and lifestyle changes.  Taking warm sitz baths can help ease pain and discomfort.  In severe cases, procedures or surgery can be done to shrink or remove the hemorrhoids. This information is not intended to replace advice given to you by your health care provider. Make sure you discuss any questions you have with your health care provider. Document Revised:  03/30/2019 Document Reviewed: 03/23/2018 Elsevier Patient Education  2020 Elsevier Inc.   Jock Itch Jock itch (tinea cruris) is an infection of the skin in the groin area. It is caused by a fungus, which is a type of germ that lives in dark, damp places. Jock itch causes an itchy rash in the groin and upper thigh area. It usually goes away in 2-3 weeks with treatment. What are the causes? The fungus that causes jock itch may be spread by:  Touching a fungus infection elsewhere on your body, such as athlete's foot, and then touching your groin area.  Sharing towels or clothing, such as socks or shoes, with someone who has a fungal infection. What increases the risk? Jock itch is most common in men and adolescent boys. You are also more likely to develop the condition if you:  Are in a hot, humid climate.  Wear tight-fitting clothing or wet bathing suits for long periods of time.  Play sports.  Are overweight.  Have diabetes.  Have a weakened immune system.  Sweat a lot. What are the signs or symptoms? Symptoms of jock itch may include:  A red, pink, or brown rash in the groin area. Blisters may be present. The rash may spread to the thighs, anus, and buttocks.  Dry and scaly skin on or around the rash.  Itchiness. How is this diagnosed? In most cases, your health care provider can make the diagnosis by looking at your rash. In some cases, a sample of infected skin may be scraped off. This sample may be examined under a microscope (biopsy) or by trying to grow the fungus from the sample (culture). How is this treated? Treatment for this condition may include:  Antifungal medicine to kill the fungus. This may be a skin cream, ointment, or powder, or it may be a medicine that you take by mouth.  Skin cream or ointment to reduce itching.  Lifestyle changes, such as wearing looser clothing and caring for your skin. Follow these instructions at home: Skin care  Apply skin  creams, ointments, or powders exactly as told by your health care provider.  Wear loose-fitting clothing that does not rub against your groin area. Men should wear boxer shorts or loose-fitting underwear.  Keep your groin area clean and dry. ? Change your underwear every day. ? Change out of wet bathing suits as soon as possible. ? After bathing, use a separate towel to dry your groin area thoroughly and gently. Using a separate towel will help prevent spreading the infection to other areas of your body.  Avoid hot baths and showers. Hot water can make itching worse.  Do not scratch the affected area. General instructions  Take and apply over-the-counter and prescription medicines only as told by your health care provider.  Do not share towels, clothing, or personal items with other people.  Wash your hands often with soap and water, especially after touching your groin area. If soap and water are not available, use alcohol-based hand sanitizer. Contact a health care provider if:  Your rash: ? Gets worse or does not get better after 2 weeks of treatment. ? Spreads. ? Returns after treatment is finished.  You have any of the following: ? A fever. ? New or worsening redness, swelling, or pain around your rash. ? Fluid, blood, or pus coming from your rash. Summary  Jock itch (tinea cruris) is a fungal infection of the skin in the groin area.  The fungus can be spread by sharing clothing or by touching a fungus infection elsewhere on your body and then touching your groin area.  Treatment may include antifungal medicine and lifestyle changes, such as keeping the area clean and dry. This information is not intended to replace advice given to you by your health care provider. Make sure you discuss any questions you have with your health care provider. Document Revised: 10/14/2017 Document Reviewed: 10/12/2017 Elsevier Patient Education  2020 [employer].

## 2020-09-24 NOTE — Progress Notes (Signed)
New Patient Office Visit  Subjective:  Patient ID: Ethan Bartunek., male    DOB: [birth year]  Age: 24 y.o. MRN: [id number]  CC:  Chief Complaint  Patient presents with  . Dysuria    HPI Ethan Carter. reports that he has been having itchy peeling and cracking skin around his scrotum, denies any lesions or ulcers.  Reports that this has been on and off since June 2021, states that his girlfriend was diagnosed with a yeast infection at that time.  Reports he has been using over-the-counter fungal spray with relief.  Reports that after he stopped using it, the rash will return.  Reports that he has had some difficulty urinating, states that occasionally it is difficult for him to initiate urination, will have feelings of incomplete bladder emptying.  Denies discharge.  Reports he has had several diagnoses of cystitis in the past year, states that he has been having "weaker erections" since having those diagnoses.  Reports that he noticed some bright red blood on the tissue 3 days ago, endorses constipation, had to strain to go, endorses discomfort in rectal area.  Has not tried anything for relief.  Reports his left eye has been itchy, states he feels like he has grit in his eye, denies injury or trauma, denies any discharge, does endorse blurry vision in left eye.  Has not tried anything for relief  Past Medical History:  Diagnosis Date  . Seasonal allergies     Past Surgical History:  Procedure Laterality Date  . ADENOIDECTOMY W/ MYRINGOTOMY    . TYMPANOSTOMY TUBE PLACEMENT     as infant/toddler    Family History  Problem Relation Age of Onset  . Drug abuse Father   . Suicidality Cousin   . Depression Maternal Aunt   . Schizophrenia Maternal Uncle   . Drug abuse Maternal Grandfather     Social History   Socioeconomic History  . Marital status: Single    Spouse name: Not on file  . Number of children: Not on file  . Years of education: Not on file  . Highest  education level: Not on file  Occupational History  . Not on file  Tobacco Use  . Smoking status: Current Every Day Smoker    Packs/day: 0.30    Years: 3.00    Pack years: 0.90  . Smokeless tobacco: Never Used  Substance and Sexual Activity  . Alcohol use: No  . Drug use: Yes    Types: Marijuana  . Sexual activity: Yes  Other Topics Concern  . Not on file  Social History Narrative  . Not on file   Social Determinants of Health   Financial Resource Strain:   . Difficulty of Paying Living Expenses: Not on file  Food Insecurity:   . Worried About Programme researcher, broadcasting/film/video in the Last Year: Not on file  . Ran Out of Food in the Last Year: Not on file  Transportation Needs:   . Lack of Transportation (Medical): Not on file  . Lack of Transportation (Non-Medical): Not on file  Physical Activity:   . Days of Exercise per Week: Not on file  . Minutes of Exercise per Session: Not on file  Stress:   . Feeling of Stress : Not on file  Social Connections:   . Frequency of Communication with Friends and Family: Not on file  . Frequency of Social Gatherings with Friends and Family: Not on file  . Attends Religious Services: Not  on file  . Active Member of Clubs or Organizations: Not on file  . Attends Banker Meetings: Not on file  . Marital Status: Not on file  Intimate Partner Violence:   . Fear of Current or Ex-Partner: Not on file  . Emotionally Abused: Not on file  . Physically Abused: Not on file  . Sexually Abused: Not on file    ROS Review of Systems  Constitutional: Negative for chills, fatigue and fever.  HENT: Positive for congestion. Negative for ear pain, postnasal drip and sneezing.   Eyes: Positive for itching and visual disturbance. Negative for discharge and redness.  Respiratory: Negative for cough, shortness of breath and wheezing.   Cardiovascular: Negative.   Gastrointestinal: Positive for constipation. Negative for abdominal pain.  Endocrine:  Negative.   Genitourinary: Positive for difficulty urinating. Negative for discharge, dysuria, frequency, genital sores, hematuria, testicular pain and urgency.  Musculoskeletal: Negative.   Skin: Positive for rash.  Allergic/Immunologic: Negative.   Neurological: Negative.   Hematological: Negative.   Psychiatric/Behavioral: Negative.     Objective:   Today's Vitals: BP 115/62 (BP Location: Right Arm, Patient Position: Sitting, Cuff Size: Normal)   Pulse 76   Temp 97.6 F (36.4 C) (Oral)   Resp 18   Ht 5\' 8"  (1.727 m)   Wt 149 lb (67.6 kg)   SpO2 98%   BMI 22.66 kg/m   Physical Exam Vitals and nursing note reviewed.  Constitutional:      General: He is not in acute distress.    Appearance: Normal appearance. He is not ill-appearing.  HENT:     Head: Normocephalic and atraumatic.     Right Ear: External ear normal.     Left Ear: External ear normal.     Nose: Nose normal.     Mouth/Throat:     Mouth: Mucous membranes are dry.     Pharynx: Oropharynx is clear.  Eyes:     General: Lids are normal.        Left eye: No foreign body, discharge or hordeolum.     Extraocular Movements: Extraocular movements intact.     Conjunctiva/sclera: Conjunctivae normal.     Left eye: Left conjunctiva is not injected.     Pupils: Pupils are equal, round, and reactive to light.  Cardiovascular:     Rate and Rhythm: Normal rate and regular rhythm.     Pulses: Normal pulses.     Heart sounds: Normal heart sounds.  Pulmonary:     Effort: Pulmonary effort is normal.     Breath sounds: Normal breath sounds.  Abdominal:     General: Abdomen is flat.     Palpations: Abdomen is soft.  Musculoskeletal:        General: Normal range of motion.     Cervical back: Normal range of motion and neck supple.  Skin:    General: Skin is warm and dry.  Neurological:     General: No focal deficit present.     Mental Status: He is alert and oriented to person, place, and time.  Psychiatric:         Mood and Affect: Mood normal.        Thought Content: Thought content normal.        Judgment: Judgment normal.     Assessment & Plan:   Problem List Items Addressed This Visit    None    Visit Diagnoses    Encounter for assessment of STD exposure    -  Primary   Relevant Orders   POCT URINALYSIS DIP (CLINITEK) (Completed)   Urine cytology ancillary only   HIV antibody (with reflex)   RPR   Difficulty urinating       Relevant Orders   Comp. Metabolic Panel (12)   Itchy eyes       Relevant Medications   cetirizine (ZYRTEC) 10 MG tablet   Constipation, unspecified constipation type       Relevant Orders   Comp. Metabolic Panel (12)   Jock itch       Relevant Medications   nystatin ointment (MYCOSTATIN)      Outpatient Encounter Medications as of 09/24/2020  Medication Sig  . ibuprofen (ADVIL) 600 MG tablet Take 1 tablet (600 mg total) by mouth every 6 (six) hours as needed.  . naproxen (NAPROSYN) 500 MG tablet Take 1 tablet (500 mg total) by mouth 2 (two) times daily with a meal.  . [DISCONTINUED] albuterol (PROVENTIL HFA;VENTOLIN HFA) 108 (90 Base) MCG/ACT inhaler Inhale 1-2 puffs into the lungs every 6 (six) hours as needed for wheezing or shortness of breath.  . [DISCONTINUED] cetirizine (ZYRTEC) 10 MG tablet Take 1 tablet (10 mg total) by mouth daily.  . cetirizine (ZYRTEC) 10 MG tablet Take 1 tablet (10 mg total) by mouth daily.  Marland Kitchen nystatin ointment (MYCOSTATIN) Apply 1 application topically 2 (two) times daily.  . [DISCONTINUED] fluticasone (FLONASE) 50 MCG/ACT nasal spray Place 1 spray into both nostrils daily.  . [DISCONTINUED] tobramycin (TOBREX) 0.3 % ophthalmic solution Place 2 drops into the right eye every 4 (four) hours.   No facility-administered encounter medications on file as of 09/24/2020.  1. Encounter for assessment of STD exposure Patient education given, states [facility], had refused shot for Rocephin, wants to wait  for lab results - POCT URINALYSIS DIP (CLINITEK) - Urine cytology ancillary only - HIV antibody (with reflex) - RPR  2. Difficulty urinating Encourage patient to increase hydration, patient education given, consider urology consult - Comp. Metabolic Panel (12)  3. Itchy eyes Encourage patient to follow-up with ophthalmology if no improvement - cetirizine (ZYRTEC) 10 MG tablet; Take 1 tablet (10 mg total) by mouth daily.  Dispense: 30 tablet; Refill: 11  4. Constipation, unspecified constipation type Patient education given, increase water and fiber intake - Comp. Metabolic Panel (12)  5. Jock itch Patient education given, trial nystatin, encouraged to keep area clean and dry - nystatin ointment (MYCOSTATIN); Apply 1 application topically 2 (two) times daily.  Dispense: 30 g; Refill: 0   I have reviewed the patient's medical history (PMH, PSH, Social History, Family History, Medications, and allergies) , and have been updated if relevant. I spent 30 minutes reviewing chart and  face to face time with patient.     Patient to follow-up in the mobile medical unit in 2 weeks Follow-up: Return in about 2 weeks (around 10/08/2020) for MMU.   Kasandra Knudsen Mayers, PA-C

## 2020-09-25 ENCOUNTER — Other Ambulatory Visit: Payer: Self-pay | Admitting: Physician Assistant

## 2020-09-25 ENCOUNTER — Telehealth: Payer: Self-pay | Admitting: *Deleted

## 2020-09-25 DIAGNOSIS — R7989 Other specified abnormal findings of blood chemistry: Secondary | ICD-10-CM

## 2020-09-25 LAB — COMP. METABOLIC PANEL (12)
AST: 132 IU/L — ABNORMAL HIGH (ref 0–40)
Albumin/Globulin Ratio: 2 (ref 1.2–2.2)
Albumin: 5 g/dL (ref 4.1–5.2)
Alkaline Phosphatase: 75 IU/L (ref 44–121)
BUN/Creatinine Ratio: 8 — ABNORMAL LOW (ref 9–20)
BUN: 9 mg/dL (ref 6–20)
Bilirubin Total: 0.5 mg/dL (ref 0.0–1.2)
Calcium: 9.9 mg/dL (ref 8.7–10.2)
Chloride: 102 mmol/L (ref 96–106)
Creatinine, Ser: 1.17 mg/dL (ref 0.76–1.27)
GFR calc Af Amer: 101 mL/min/{1.73_m2} (ref >59)
GFR calc non Af Amer: 87 mL/min/{1.73_m2} (ref >59)
Globulin, Total: 2.5 g/dL (ref 1.5–4.5)
Glucose: 84 mg/dL (ref 65–99)
Potassium: 4 mmol/L (ref 3.5–5.2)
Sodium: 141 mmol/L (ref 134–144)
Total Protein: 7.5 g/dL (ref 6.0–8.5)

## 2020-09-25 LAB — RPR: RPR Ser Ql: NONREACTIVE

## 2020-09-25 LAB — HIV ANTIBODY (ROUTINE TESTING W REFLEX): HIV Screen 4th Generation wRfx: NONREACTIVE

## 2020-09-25 NOTE — Telephone Encounter (Signed)
Patient verified DOB Patient is aware of HIV, RPR, kidneys being normal. Patient is aware of liver enzyme level being elevated and needing to have further testing Patient is scheduled for lab on tomorrow at 9:45.

## 2020-09-25 NOTE — Telephone Encounter (Signed)
-----   Message from Roney Jaffe, [place] sent at 09/25/2020 10:38 AM EST ----- Please call patient and let him know that his screening for HIV and syphilis were negative, his kidney function is within normal limits, he does have an elevated liver enzyme.  When he comes in for his follow-up visit on the mobile medicine unit, we will do some follow-up labs regarding the elevated liver enzyme.  In the meantime encouraged to avoid alcohol and Tylenol.  Urine cytology still pending

## 2020-09-26 ENCOUNTER — Other Ambulatory Visit: Payer: Self-pay

## 2020-09-26 ENCOUNTER — Other Ambulatory Visit (INDEPENDENT_AMBULATORY_CARE_PROVIDER_SITE_OTHER): Payer: BC Managed Care – PPO

## 2020-09-26 DIAGNOSIS — R7989 Other specified abnormal findings of blood chemistry: Secondary | ICD-10-CM

## 2020-09-27 LAB — HEPATITIS PANEL, ACUTE
Hep A IgM: NEGATIVE
Hep B C IgM: NEGATIVE
Hep C Virus Ab: <0.1 s/co ratio (ref 0.0–0.9)
Hepatitis B Surface Ag: NEGATIVE

## 2020-09-27 LAB — HEPATIC FUNCTION PANEL
ALT: 57 IU/L — ABNORMAL HIGH (ref 0–44)
AST: 81 IU/L — ABNORMAL HIGH (ref 0–40)
Albumin: 4.7 g/dL (ref 4.1–5.2)
Alkaline Phosphatase: 74 IU/L (ref 44–121)
Bilirubin Total: 0.3 mg/dL (ref 0.0–1.2)
Bilirubin, Direct: 0.11 mg/dL (ref 0.00–0.40)
Total Protein: 7.4 g/dL (ref 6.0–8.5)

## 2020-10-01 LAB — URINE CYTOLOGY ANCILLARY ONLY
Chlamydia: NEGATIVE
Comment: NEGATIVE
Comment: NORMAL
Neisseria Gonorrhea: NEGATIVE

## 2020-10-03 ENCOUNTER — Telehealth: Payer: Self-pay | Admitting: General Practice

## 2020-10-03 NOTE — Telephone Encounter (Signed)
Pt requesting a call regarding his recent labwork as soon as possible &  wondering about his liver condition. Pt states he was told to abstain from drinking alcohol and has followed this.

## 2020-10-06 NOTE — Telephone Encounter (Signed)
I connected with this patient went over all his lab results his liver function is coming down he did reveal to me he had been binge drinking  He is no longer drinking alcohol.  His primary care provider is at [facility] and he is going to call them and reestablish primary care  I went over all his STI studies and his urine studies  He still having some burning on urination he says he will can reconnect with his primary care provider on that symptom complex  I answered all his other questions including giving him all his lab results

## 2020-10-06 NOTE — Telephone Encounter (Signed)
The labs haven't been noted on. I don't have anything to give him.  Once the [facility] provider has noted on them, call him with the results.

## 2020-10-06 NOTE — Telephone Encounter (Signed)
MMU provider is out for 2 more days will forward to covering provider for patients convenience.

## 2020-10-06 NOTE — Telephone Encounter (Signed)
Patient was seen by MMU at [facility] and redraw of labs completed at [facility]. MMU is at a vaccine clinic please share results with patient.

## 2020-10-06 NOTE — Telephone Encounter (Signed)
Patient saw [employer] [employer]

## 2021-01-15 ENCOUNTER — Telehealth: Payer: BC Managed Care – PPO | Admitting: Internal Medicine

## 2021-05-12 IMAGING — DX DG ANKLE COMPLETE 3+V*L*
3 series · 3 of 3 positions shown · non-contrast
Comparison: Right ankle radiograph dated 03/24/2011.

CLINICAL DATA: 24-year-old male with trauma to the left ankle.

EXAM:
LEFT ANKLE COMPLETE - 3+ VIEW

[ankle ap]
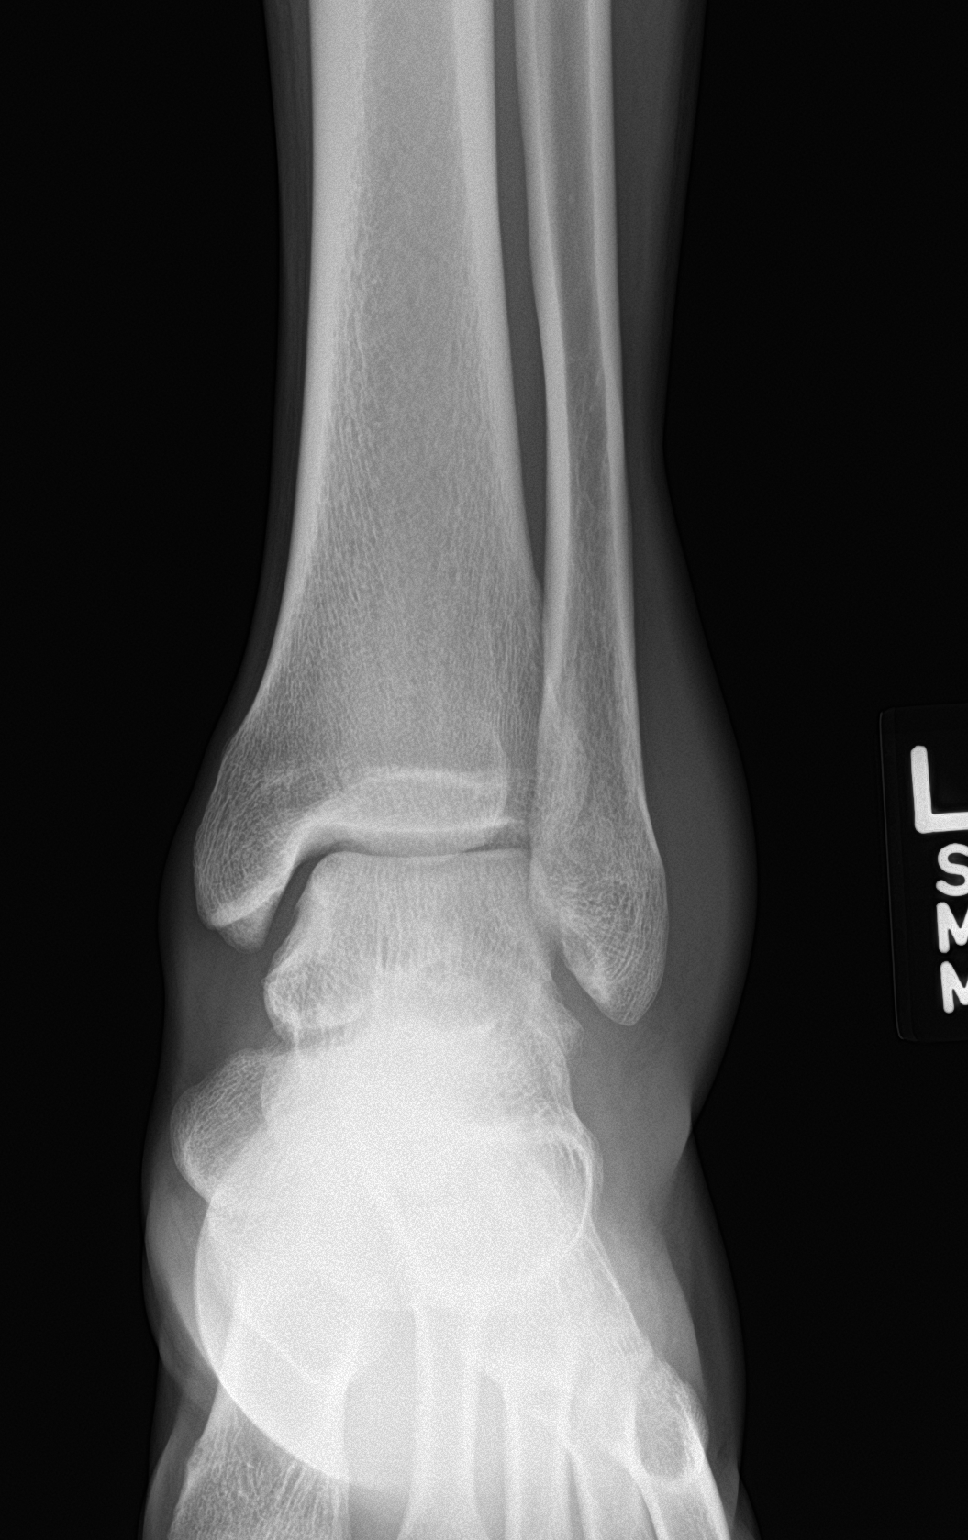

[ankle obl]
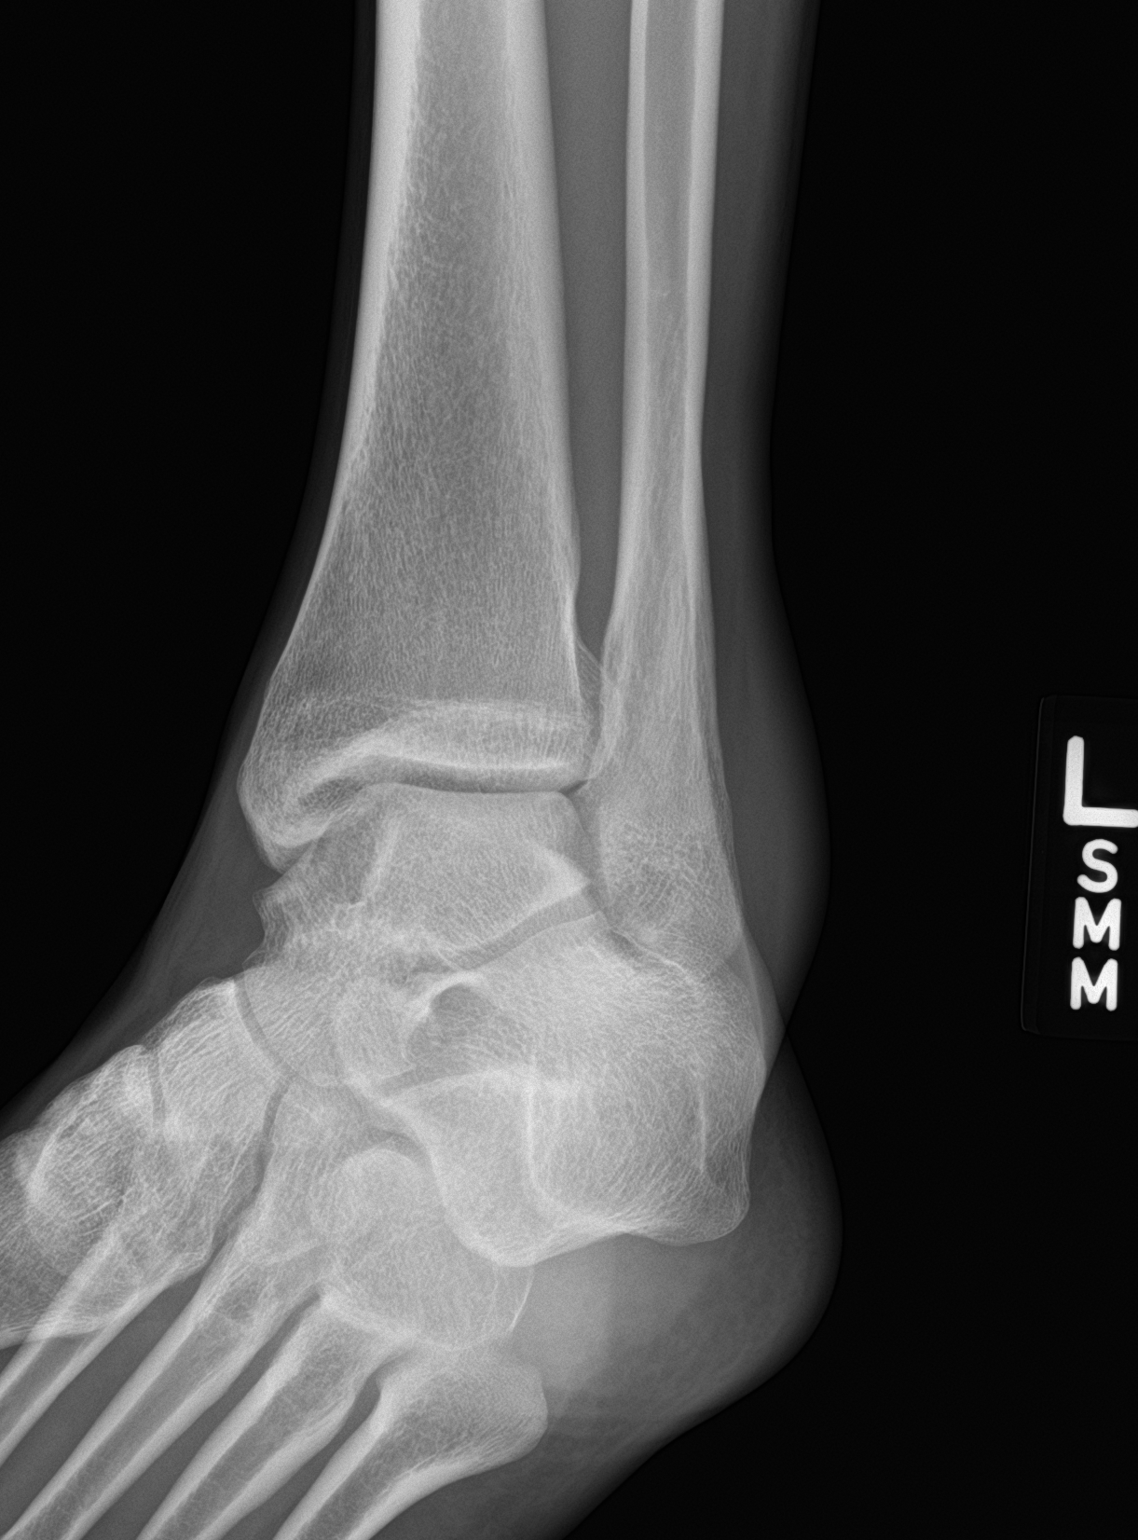

[ankle lat]
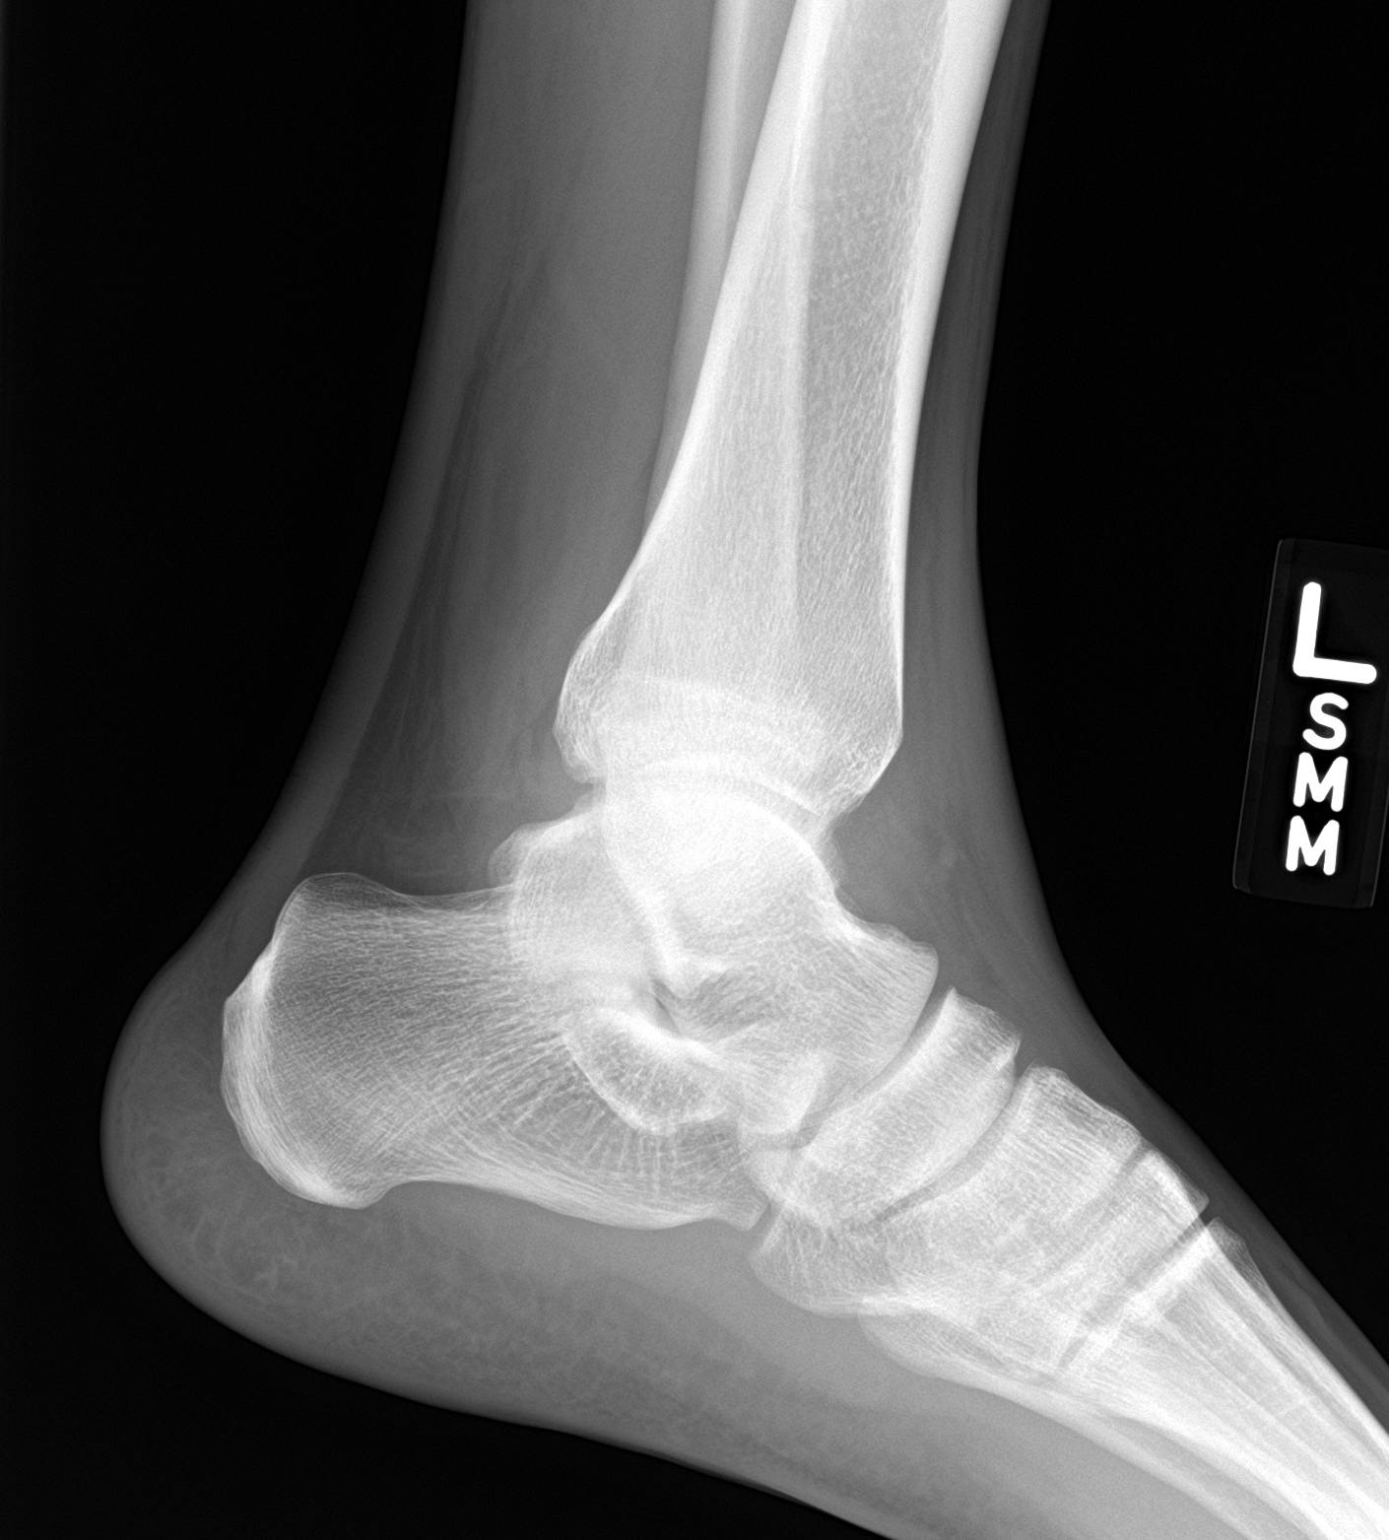

[3 of 3 positions shown; findings below may reference images not displayed]

FINDINGS: There is no acute fracture or dislocation. The bones are well
mineralized. No arthritic changes. The ankle mortise is intact.
There is soft tissue swelling over the lateral malleolus. No
radiopaque foreign object or soft tissue gas.
IMPRESSION: No acute fracture or dislocation.

## 2021-11-21 ENCOUNTER — Emergency Department (HOSPITAL_COMMUNITY): Payer: Self-pay

## 2021-11-21 ENCOUNTER — Encounter (HOSPITAL_COMMUNITY): Payer: Self-pay | Admitting: Emergency Medicine

## 2021-11-21 ENCOUNTER — Emergency Department (HOSPITAL_COMMUNITY)
Admission: EM | Admit: 2021-11-21 | Discharge: 2021-11-21 | Disposition: A | Discharge status: Home / Self Care | Payer: Self-pay | Attending: Emergency Medicine | Admitting: Emergency Medicine

## 2021-11-21 DIAGNOSIS — Z79899 Other long term (current) drug therapy: Secondary | ICD-10-CM | POA: Insufficient documentation

## 2021-11-21 DIAGNOSIS — S0591XA Unspecified injury of right eye and orbit, initial encounter: Secondary | ICD-10-CM | POA: Insufficient documentation

## 2021-11-21 DIAGNOSIS — H532 Diplopia: Secondary | ICD-10-CM | POA: Insufficient documentation

## 2021-11-21 DIAGNOSIS — Y9367 Activity, basketball: Secondary | ICD-10-CM | POA: Insufficient documentation

## 2021-11-21 DIAGNOSIS — W500XXA Accidental hit or strike by another person, initial encounter: Secondary | ICD-10-CM | POA: Insufficient documentation

## 2021-11-21 MED ORDER — TETRACAINE HCL 0.5 % OP SOLN: 2.0000 [drp] | Freq: Once | OPHTHALMIC | Status: DC

## 2021-11-21 MED ORDER — FLUORESCEIN SODIUM 1 MG OP STRP
1.0000 | ORAL_STRIP | Freq: Once | OPHTHALMIC | Status: AC
  Administered 2021-11-21: 1 via OPHTHALMIC
  Filled 2021-11-21: qty 1

## 2021-11-21 MED ORDER — ACETAMINOPHEN 325 MG PO TABS
650.0000 mg | ORAL_TABLET | Freq: Once | ORAL | Status: AC
  Administered 2021-11-21: 650 mg via ORAL
  Filled 2021-11-21: qty 2

## 2021-11-21 MED ORDER — TETRACAINE HCL 0.5 % OP SOLN
2.0000 [drp] | Freq: Once | OPHTHALMIC | Status: AC
  Administered 2021-11-21: 2 [drp] via OPHTHALMIC
  Filled 2021-11-21: qty 4

## 2021-11-21 MED ORDER — NAPROXEN 250 MG PO TABS: 500.0000 mg | ORAL_TABLET | Freq: Once | ORAL | Status: DC

## 2021-11-21 NOTE — Discharge Instructions (Addendum)
Your CT results are within normal limits today.  You may take Tylenol or ibuprofen to help with your pain.  You may also apply ice every 20 minutes in order to help with any swelling.  Please follow-up with your primary care physician.

## 2021-11-21 NOTE — ED Triage Notes (Signed)
Pt reports eye injury to right eye while playing basketball. Endorses eye sensitivity and pain to right side of face.

## 2021-11-21 NOTE — ED Notes (Signed)
Patient transported to CT 

## 2021-11-21 NOTE — ED Provider Triage Note (Signed)
Emergency Medicine Provider Triage Evaluation Note  Ethan Carter , a 26 y.o. male  was evaluated in triage.  Pt complains of right eye pain.  States that same began prior to arrival when he was playing basketball and got hit in the eye with a elbow.  States that originally he was having diplopia and blurry vision, however now states that his vision has returned to normal.  Does endorse some numbness and tingling in the right cheek and under the eye.  Review of Systems  Positive:  Negative: See above  Physical Exam  BP (!) 134/56 (BP Location: Right Arm)    Pulse (!) 53    Temp 98.6 F (37 C)    Resp 16    SpO2 99%  Gen:   Awake, no distress   Resp:  Normal effort  MSK:   Moves extremities without difficulty  Other:  PEERLA, EOMs intact and painless. No hyphema or other abnormality seen  Medical Decision Making  Medically screening exam initiated at 3:25 PM.  Appropriate orders placed.  Ethan Carter. was informed that the remainder of the evaluation will be completed by another provider, this initial triage assessment does not replace that evaluation, and the importance of remaining in the ED until their evaluation is complete.     Silva Bandy, PA-C 11/21/21 1529

## 2021-11-21 NOTE — ED Provider Notes (Signed)
[facility] EMERGENCY DEPARTMENT Provider Note   CSN: [id number] Arrival date & time: 11/21/21  1418     History  Chief Complaint  Patient presents with   Eye Injury    Ethan Carter. is a 26 y.o. male.  26 year old male with no past medical history presents to the ED status post right eye injury.  Patient reports he was playing basketball, when another teammate jumped up and landed on his right eye with his left elbow.  He reports since the injury he has had pain to the right eye, exacerbated with any eye movement, reports he had double vision for 15 minutes after the injury.  Since then he has had worse sensitivity to light, also pain with any movement of his eye.  Also has pain along the right cheek along with some bruising noted.  He has not taken any medication for improvement in symptoms.  No headache, no nausea, no vomiting, no dizziness  The history is provided by the patient and medical records.  Eye Injury This is a new problem.      Home Medications Prior to Admission medications   Medication Sig Start Date End Date Taking? Authorizing Provider  cetirizine (ZYRTEC) 10 MG tablet Take 1 tablet (10 mg total) by mouth daily. 09/24/20   Mayers, Cari S, PA-C  ibuprofen (ADVIL) 600 MG tablet Take 1 tablet (600 mg total) by mouth every 6 (six) hours as needed. 05/08/20   Darr, Edison Nasuti, PA-C  naproxen (NAPROSYN) 500 MG tablet Take 1 tablet (500 mg total) by mouth 2 (two) times daily with a meal. 12/03/16   Timmothy Euler, [place] D, PA-C  nystatin ointment (MYCOSTATIN) Apply 1 application topically 2 (two) times daily. 09/24/20   Mayers, Cari S, PA-C      Allergies    Lactose intolerance (gi)    Review of Systems   Review of Systems  Constitutional:  Negative for chills and fever.  Eyes:  Positive for photophobia, pain and redness.  Skin:  Positive for wound.   Physical Exam Updated Vital Signs BP (!) 134/56 (BP Location: Right Arm)    Pulse (!) 53    Temp  98.6 F (37 C)    Resp 16    SpO2 99%  Physical Exam Vitals and nursing note reviewed.  Constitutional:      Appearance: Normal appearance.  HENT:     Head: Normocephalic and atraumatic.     Nose: Nose normal.     Mouth/Throat:     Mouth: Mucous membranes are moist.  Eyes:     Extraocular Movements:     Right eye: Abnormal extraocular motion present. No nystagmus.     Left eye: Normal extraocular motion and no nystagmus.     Conjunctiva/sclera:     Right eye: Right conjunctiva is injected. No chemosis, exudate or hemorrhage. Cardiovascular:     Rate and Rhythm: Normal rate.  Pulmonary:     Effort: Pulmonary effort is normal.  Abdominal:     General: Abdomen is flat.  Musculoskeletal:     Cervical back: Normal range of motion and neck supple.  Skin:    General: Skin is warm and dry.  Neurological:     Mental Status: He is alert and oriented to person, place, and time.    ED Results / Procedures / Treatments   Labs (all labs ordered are listed, but only abnormal results are displayed) Labs Reviewed - No data to display  EKG None  Radiology CT  HEAD WO CONTRAST (5MM)  Result Date: 11/21/2021 CLINICAL DATA:  Elbowed in the right eye, pain EXAM: CT HEAD WITHOUT CONTRAST CT MAXILLOFACIAL WITHOUT CONTRAST TECHNIQUE: Multidetector CT imaging of the head and maxillofacial structures were performed using the standard protocol without intravenous contrast. Multiplanar CT image reconstructions of the maxillofacial structures were also generated. COMPARISON:  12/13/2011 FINDINGS: CT HEAD FINDINGS Brain: No evidence of acute infarction, hemorrhage, hydrocephalus, extra-axial collection or mass lesion/mass effect. Vascular: No hyperdense vessel or unexpected calcification. Skull: Normal. Negative for fracture or focal lesion. Other: None. CT MAXILLOFACIAL FINDINGS Osseous: Small depressed fractures of the right inferior lamina papyracea (series 5, image 24), closely abutting the underlying  right ostiomeatal complex (series 5, image 30). There is fat herniation without rectus herniation. The remaining right bony orbit is intact, including the orbital floor. Orbits: Orbital soft tissue contents appear intact. No traumatic or inflammatory finding. Sinuses: Small hyperdense air-fluid level of the right maxillary sinus (series 3, image 54). Soft tissues: Soft tissue contusion of the right cheek (series 3, image 52). IMPRESSION: 1. No acute intracranial pathology. 2. Small depressed fractures of the right inferior lamina papyracea, closely abutting the underlying right ostiomeatal complex. The remaining right bony orbit is intact, including the orbital floor. 3. Small hyperdense air-fluid level of the right maxillary sinus, presumably hemorrhage. 4. Soft tissue contusion of the right cheek. Electronically Signed   By: Delanna Ahmadi M.D.   On: 11/21/2021 18:49   CT Maxillofacial Wo Contrast  Result Date: 11/21/2021 CLINICAL DATA:  Elbowed in the right eye, pain EXAM: CT HEAD WITHOUT CONTRAST CT MAXILLOFACIAL WITHOUT CONTRAST TECHNIQUE: Multidetector CT imaging of the head and maxillofacial structures were performed using the standard protocol without intravenous contrast. Multiplanar CT image reconstructions of the maxillofacial structures were also generated. COMPARISON:  12/13/2011 FINDINGS: CT HEAD FINDINGS Brain: No evidence of acute infarction, hemorrhage, hydrocephalus, extra-axial collection or mass lesion/mass effect. Vascular: No hyperdense vessel or unexpected calcification. Skull: Normal. Negative for fracture or focal lesion. Other: None. CT MAXILLOFACIAL FINDINGS Osseous: Small depressed fractures of the right inferior lamina papyracea (series 5, image 24), closely abutting the underlying right ostiomeatal complex (series 5, image 30). There is fat herniation without rectus herniation. The remaining right bony orbit is intact, including the orbital floor. Orbits: Orbital soft tissue contents  appear intact. No traumatic or inflammatory finding. Sinuses: Small hyperdense air-fluid level of the right maxillary sinus (series 3, image 54). Soft tissues: Soft tissue contusion of the right cheek (series 3, image 52). IMPRESSION: 1. No acute intracranial pathology. 2. Small depressed fractures of the right inferior lamina papyracea, closely abutting the underlying right ostiomeatal complex. The remaining right bony orbit is intact, including the orbital floor. 3. Small hyperdense air-fluid level of the right maxillary sinus, presumably hemorrhage. 4. Soft tissue contusion of the right cheek. Electronically Signed   By: Delanna Ahmadi M.D.   On: 11/21/2021 18:49    Procedures Procedures    Medications Ordered in ED Medications  fluorescein ophthalmic strip 1 strip (1 strip Both Eyes Given 11/21/21 1814)  tetracaine (PONTOCAINE) 0.5 % ophthalmic solution 2 drop (2 drops Both Eyes Given 11/21/21 1814)  acetaminophen (TYLENOL) tablet 650 mg (650 mg Oral Given 11/21/21 1812)    ED Course/ Medical Decision Making/ A&P                           Medical Decision Making  Presents with a ED status post injury.  Patient  was playing basket when suddenly the players went up to dunk a ball and accidentally landed on his right eye with their elbow.  He reports pain to the area, had some diplopia at first, this has now resolved.  There is some significant photophobia, does have some pain with EOMs however they are still intact.  During my evaluation the right conjunctivae appears injected, no hemorrhage noted, no stable injury to the eyelid, eyebrow.  There is some pain with palpation along the right zygomatic bone.  Does report feeling some numbness to the area when the event occurred.  Some suspicion for orbital fracture, will obtain further imaging in order to evaluate this.  Given Tylenol for pain control.  CT Maxillofacial/Head:  1. No acute intracranial pathology.  2. Small depressed fractures of the  right inferior lamina papyracea,  closely abutting the underlying right ostiomeatal complex. The  remaining right bony orbit is intact, including the orbital floor.  3. Small hyperdense air-fluid level of the right maxillary sinus,  presumably hemorrhage.  4. Soft tissue contusion of the right cheek.      Patient's exam has improved after receiving Tylenol.  I do not feel that there is any acute finding at this time.  He is overall in stable condition at this time, stable for outpatient follow-up  Portions of this note were generated with Dragon dictation software. Dictation errors may occur despite best attempts at proofreading.  Final Clinical Impression(s) / ED Diagnoses Final diagnoses:  Right eye injury, initial encounter    Rx / DC Orders ED Discharge Orders     None         Janeece Fitting, Hershal Coria 11/21/21 2006    Drenda Freeze, MD 11/22/21 1455

## 2021-11-26 DIAGNOSIS — Z13220 Encounter for screening for lipoid disorders: Secondary | ICD-10-CM | POA: Diagnosis not present

## 2021-11-26 DIAGNOSIS — Z0000 Encounter for general adult medical examination without abnormal findings: Secondary | ICD-10-CM | POA: Diagnosis not present

## 2021-11-26 DIAGNOSIS — R300 Dysuria: Secondary | ICD-10-CM | POA: Diagnosis not present

## 2021-12-09 DIAGNOSIS — S0231XA Fracture of orbital floor, right side, initial encounter for closed fracture: Secondary | ICD-10-CM | POA: Diagnosis not present

## 2021-12-09 DIAGNOSIS — S0285XD Fracture of orbit, unspecified, subsequent encounter for fracture with routine healing: Secondary | ICD-10-CM | POA: Insufficient documentation

## 2021-12-10 DIAGNOSIS — Z113 Encounter for screening for infections with a predominantly sexual mode of transmission: Secondary | ICD-10-CM | POA: Diagnosis not present

## 2021-12-10 DIAGNOSIS — R800 Isolated proteinuria: Secondary | ICD-10-CM | POA: Diagnosis not present

## 2021-12-10 DIAGNOSIS — N3943 Post-void dribbling: Secondary | ICD-10-CM | POA: Diagnosis not present

## 2021-12-10 DIAGNOSIS — R300 Dysuria: Secondary | ICD-10-CM | POA: Diagnosis not present

## 2022-02-23 ENCOUNTER — Other Ambulatory Visit: Payer: Self-pay

## 2022-02-23 ENCOUNTER — Encounter (HOSPITAL_BASED_OUTPATIENT_CLINIC_OR_DEPARTMENT_OTHER): Payer: Self-pay | Admitting: Emergency Medicine

## 2022-02-23 DIAGNOSIS — Y92410 Unspecified street and highway as the place of occurrence of the external cause: Secondary | ICD-10-CM | POA: Insufficient documentation

## 2022-02-23 DIAGNOSIS — S01511A Laceration without foreign body of lip, initial encounter: Secondary | ICD-10-CM | POA: Insufficient documentation

## 2022-02-23 DIAGNOSIS — Z5321 Procedure and treatment not carried out due to patient leaving prior to being seen by health care provider: Secondary | ICD-10-CM | POA: Insufficient documentation

## 2022-02-23 MED ORDER — IBUPROFEN 400 MG PO TABS
400.0000 mg | ORAL_TABLET | Freq: Once | ORAL | Status: AC | PRN
  Administered 2022-02-23: 400 mg via ORAL
  Filled 2022-02-23: qty 1
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
## 2022-02-24 ENCOUNTER — Emergency Department (HOSPITAL_BASED_OUTPATIENT_CLINIC_OR_DEPARTMENT_OTHER)
Admission: EM | Admit: 2022-02-24 | Discharge: 2022-02-24 | Disposition: A | Discharge status: Home / Self Care | Payer: Self-pay | Attending: Emergency Medicine | Admitting: Emergency Medicine

## 2023-12-02 ENCOUNTER — Ambulatory Visit
Admission: EM | Admit: 2023-12-02 | Discharge: 2023-12-02 | Discharge status: Against Medical Advice | Payer: Medicaid Other

## 2023-12-02 ENCOUNTER — Ambulatory Visit: Payer: Self-pay | Admitting: *Deleted

## 2023-12-02 NOTE — ED Triage Notes (Signed)
Call #4, No Answer. Patient did not come back in  when called the first time when he stated he was in the restaurant next door.

## 2023-12-02 NOTE — ED Triage Notes (Signed)
Called for patient (for intake/vs). He states "he is in the restaurant next door". Advised him to come over for Intake and/or wait in car if possible.

## 2023-12-02 NOTE — ED Triage Notes (Signed)
Patient called from waiting area, still not present.   Removed from floor.

## 2023-12-02 NOTE — ED Triage Notes (Signed)
Called patient 2 more times by phone. No Answer. Patient is no where to be found and unable to contact.

## 2023-12-02 NOTE — Telephone Encounter (Signed)
Reason for Disposition  SEVERE pain (e.g., excruciating)  Answer Assessment - Initial Assessment Questions 1. APPEARANCE of BOIL: "What does the boil look like?"      Patient states large area- 2 separate boils 2. LOCATION: "Where is the boil located?"      Left bottock 3. NUMBER: "How many boils are there?"      one 4. SIZE: "How big is the boil?" (e.g., inches, cm; compare to size of a coin or other object)     large 5. ONSET: "When did the boil start?"     Started yesterday 6. PAIN: "Is there any pain?" If Yes, ask: "How bad is the pain?"   (Scale 1-10; or mild, moderate, severe)     severe 7. FEVER: "Do you have a fever?" If Yes, ask: "What is it, how was it measured, and when did it start?"      no 8. SOURCE: "Have you been around anyone with boils or other Staph infections?" "Have you ever had boils before?"     unsure  Protocols used: Boil (Skin Abscess)-A-AH

## 2023-12-02 NOTE — Telephone Encounter (Signed)
  Chief Complaint: boil left buttock Symptoms: large boil/cyst on buttock- severe pain- hard to sit/walk Frequency: started hurting yesterday Pertinent Negatives: Patient denies drainage,fever Disposition: [] ED /[x] Urgent Care (no appt availability in office) / [] Appointment(In office/virtual)/ []  [facility]/ [] Home Care/ [] Refused Recommended Disposition /[]  Mobile Bus/ []  Follow-up with PCP Additional Notes: No PCP- advised UC.

## 2024-08-28 ENCOUNTER — Other Ambulatory Visit: Payer: Self-pay

## 2024-08-28 ENCOUNTER — Encounter (HOSPITAL_COMMUNITY): Payer: Self-pay

## 2024-08-28 ENCOUNTER — Emergency Department (HOSPITAL_COMMUNITY)
Admission: EM | Admit: 2024-08-28 | Discharge: 2024-08-28 | Disposition: A | Discharge status: Home / Self Care | Attending: Emergency Medicine | Admitting: Emergency Medicine

## 2024-08-28 DIAGNOSIS — L0291 Cutaneous abscess, unspecified: Secondary | ICD-10-CM

## 2024-08-28 DIAGNOSIS — L0231 Cutaneous abscess of buttock: Secondary | ICD-10-CM | POA: Diagnosis present

## 2024-08-28 LAB — CBC WITH DIFFERENTIAL/PLATELET
Abs Immature Granulocytes: 0.02 K/uL (ref 0.00–0.07)
Basophils Absolute: 0 K/uL (ref 0.0–0.1)
Basophils Relative: 0 %
Eosinophils Absolute: 0.1 K/uL (ref 0.0–0.5)
Eosinophils Relative: 1 %
HCT: 48.6 % (ref 39.0–52.0)
Hemoglobin: 15.3 g/dL (ref 13.0–17.0)
Immature Granulocytes: 0 %
Lymphocytes Relative: 12 %
Lymphs Abs: 1.1 K/uL (ref 0.7–4.0)
MCH: 27.5 pg (ref 26.0–34.0)
MCHC: 31.5 g/dL (ref 30.0–36.0)
MCV: 87.3 fL (ref 80.0–100.0)
Monocytes Absolute: 1.1 K/uL — ABNORMAL HIGH (ref 0.1–1.0)
Monocytes Relative: 12 %
Neutro Abs: 6.9 K/uL (ref 1.7–7.7)
Neutrophils Relative %: 75 %
Platelets: 188 K/uL (ref 150–400)
RBC: 5.57 MIL/uL (ref 4.22–5.81)
RDW: 12.6 % (ref 11.5–15.5)
WBC: 9.2 K/uL (ref 4.0–10.5)
nRBC: 0 % (ref 0.0–0.2)

## 2024-08-28 LAB — COMPREHENSIVE METABOLIC PANEL WITH GFR
ALT: 17 U/L (ref 0–44)
AST: 24 U/L (ref 15–41)
Albumin: 4.4 g/dL (ref 3.5–5.0)
Alkaline Phosphatase: 76 U/L (ref 38–126)
Anion gap: 10 (ref 5–15)
BUN: 11 mg/dL (ref 6–20)
CO2: 26 mmol/L (ref 22–32)
Calcium: 9.5 mg/dL (ref 8.9–10.3)
Chloride: 101 mmol/L (ref 98–111)
Creatinine, Ser: 1.21 mg/dL (ref 0.61–1.24)
GFR, Estimated: >60 mL/min (ref >60)
Glucose, Bld: 70 mg/dL (ref 70–99)
Potassium: 4.1 mmol/L (ref 3.5–5.1)
Sodium: 137 mmol/L (ref 135–145)
Total Bilirubin: 0.9 mg/dL (ref 0.0–1.2)
Total Protein: 7.6 g/dL (ref 6.5–8.1)

## 2024-08-28 MED ORDER — DOXYCYCLINE HYCLATE 100 MG PO CAPS: 100.0000 mg | ORAL_CAPSULE | Freq: Two times a day (BID) | ORAL | 0 refills | Status: AC

## 2024-08-28 MED ORDER — TETANUS-DIPHTH-ACELL PERTUSSIS 5-2-15.5 LF-MCG/0.5 IM SUSP
0.5000 mL | Freq: Once | INTRAMUSCULAR | Status: DC
  Filled 2024-08-28: qty 0.5

## 2024-08-28 MED ORDER — DOXYCYCLINE HYCLATE 100 MG PO TABS
100.0000 mg | ORAL_TABLET | Freq: Once | ORAL | Status: AC
  Administered 2024-08-28: 100 mg via ORAL
  Filled 2024-08-28: qty 1

## 2024-08-28 MED ORDER — HYDROCODONE-ACETAMINOPHEN 5-325 MG PO TABS
1.0000 | ORAL_TABLET | Freq: Once | ORAL | Status: AC
  Administered 2024-08-28: 1 via ORAL
  Filled 2024-08-28: qty 1

## 2024-08-28 MED ORDER — LIDOCAINE-EPINEPHRINE (PF) 2 %-1:200000 IJ SOLN
10.0000 mL | Freq: Once | INTRAMUSCULAR | Status: AC
  Administered 2024-08-28: 10 mL
  Filled 2024-08-28: qty 20

## 2024-08-28 NOTE — ED Notes (Signed)
 Patient had a TDAP vaccine June 2025

## 2024-08-28 NOTE — Discharge Instructions (Addendum)
 Evaluation here was overall reassuring.  Suspect she did have a tiny abscess in your left buttock.  It will continue to drain at home.  Recommend cleaning the site once daily starting tomorrow and follow-up with your PCP in a few days for reevaluation.  I am starting on doxycycline for treatment.  Please take the whole course even if your symptoms resolve.

## 2024-08-28 NOTE — ED Provider Notes (Signed)
 [facility] Provider Note   CSN: [id number] Arrival date & time: 08/28/24  1321     Patient presents with: Abscess  HPI Ethan Carter. is a 28 y.o. male presenting for abscess to the left buttock.  He noticed it this past Sunday.  Also he feels that he has swollen lymph nodes in the left groin.  Denies abdominal pain.  States has had an abscess in this area before this required drainage.  It is painful.  Denies fever.    Abscess      Prior to Admission medications   Medication Sig Start Date End Date Taking? Authorizing Provider  doxycycline (VIBRAMYCIN) 100 MG capsule Take 1 capsule (100 mg total) by mouth 2 (two) times daily. 08/28/24  Yes Lang Norleen POUR, PA-C  cetirizine  (ZYRTEC ) 10 MG tablet Take 1 tablet (10 mg total) by mouth daily. 09/24/20   Mayers, Cari S, PA-C  ibuprofen  (ADVIL ) 600 MG tablet Take 1 tablet (600 mg total) by mouth every 6 (six) hours as needed. 05/08/20   Darr, Jacob, PA-C  imiquimod (ALDARA) 5 % cream Apply topically. 09/28/23   [provider]  naproxen  (NAPROSYN ) 500 MG tablet Take 1 tablet (500 mg total) by mouth 2 (two) times daily with a meal. 12/03/16   Starla, [place] D, PA-C  nystatin  ointment (MYCOSTATIN ) Apply 1 application topically 2 (two) times daily. 09/24/20   Mayers, Cari S, PA-C    Allergies: Lactose intolerance (gi)    Review of Systems See HPI  Updated Vital Signs BP 136/77 (BP Location: Right Arm)   Pulse 78   Temp 98.3 F (36.8 C) (Oral)   Resp 18   SpO2 98%   Physical Exam Exam conducted with a chaperone present.  Constitutional:      Appearance: Normal appearance.  HENT:     Head: Normocephalic.     Nose: Nose normal.  Eyes:     Conjunctiva/sclera: Conjunctivae normal.  Pulmonary:     Effort: Pulmonary effort is normal.  Genitourinary:  Neurological:     Mental Status: He is alert.  Psychiatric:        Mood and Affect: Mood normal.     (all  labs ordered are listed, but only abnormal results are displayed) Labs Reviewed  CBC WITH DIFFERENTIAL/PLATELET - Abnormal; Notable for the following components:      Result Value   Monocytes Absolute 1.1 (*)    All other components within normal limits  COMPREHENSIVE METABOLIC PANEL WITH GFR    EKG: None  Radiology: No results found.   .Incision and Drainage  Date/Time: 08/28/2024 6:18 PM  Performed by: Lang Norleen POUR, PA-C Authorized by: Lang Norleen POUR, PA-C   Consent:    Consent obtained:  Verbal   Consent given by:  Patient   Risks discussed:  Bleeding, incomplete drainage, pain and damage to other organs   Alternatives discussed:  No treatment Universal protocol:    Procedure explained and questions answered to patient or proxy's satisfaction: yes     Relevant documents present and verified: yes     Test results available : yes     Imaging studies available: yes     Required blood products, implants, devices, and special equipment available: yes     Site/side marked: yes     Immediately prior to procedure, a time out was called: yes     Patient identity confirmed:  Verbally with patient Location:    Type:  Abscess Pre-procedure details:    Skin preparation:  Betadine Anesthesia:    Anesthesia method:  Local infiltration   Local anesthetic:  Lidocaine 1% WITH epi Procedure type:    Complexity:  Complex Procedure details:    Incision types:  Single straight   Incision depth:  Subcutaneous   Wound management:  Probed and deloculated, irrigated with saline and extensive cleaning   Drainage:  Bloody   Drainage amount:  Scant   Packing materials:  None Post-procedure details:    Procedure completion:  Tolerated well, no immediate complications    Medications Ordered in the ED  lidocaine-EPINEPHrine (XYLOCAINE W/EPI) 2 %-1:200000 (PF) injection 10 mL (has no administration in time range)  doxycycline (VIBRA-TABS) tablet 100 mg (has no administration in time  range)  HYDROcodone-acetaminophen  (NORCO/VICODIN) 5-325 MG per tablet 1 tablet (1 tablet Oral Given 08/28/24 1722)                                    Medical Decision Making Amount and/or Complexity of Data Reviewed Labs: ordered.  Risk Prescription drug management.   28 year old well-appearing male presenting for concern for abscess.  Exam notable for abscess in the left buttock without perianal.  I&D procedure but only relieved a small amount of bloody discharge.  Start him on doxycycline.  Advised him to follow-up with his PCP.  Discussed wound care at home.  Discussed return precautions.  Discharged good condition.     Final diagnoses:  Abscess    ED Discharge Orders          Ordered    doxycycline (VIBRAMYCIN) 100 MG capsule  2 times daily        08/28/24 1820               Lang Norleen POUR, PA-C 08/28/24 1821    Lenor Hollering, MD 08/28/24 2321

## 2024-08-28 NOTE — ED Triage Notes (Addendum)
 Pt has an abscess to his left buttock since Sunday. Pt also has swelling to the lymph nodes in his left groin.
